# Patient Record
Sex: Female | Born: 1955 | Race: White | Hispanic: No | Marital: Married | Smoking: Former smoker
Health system: Southern US, Community
[De-identification: ages and names within clinical notes are randomized; demographics above are authoritative.]

## PROBLEM LIST (undated history)

## (undated) DIAGNOSIS — K469 Unspecified abdominal hernia without obstruction or gangrene: Secondary | ICD-10-CM

## (undated) DIAGNOSIS — K219 Gastro-esophageal reflux disease without esophagitis: Secondary | ICD-10-CM

## (undated) DIAGNOSIS — I10 Essential (primary) hypertension: Secondary | ICD-10-CM

## (undated) DIAGNOSIS — N289 Disorder of kidney and ureter, unspecified: Secondary | ICD-10-CM

## (undated) DIAGNOSIS — R110 Nausea: Secondary | ICD-10-CM

## (undated) DIAGNOSIS — R079 Chest pain, unspecified: Secondary | ICD-10-CM

## (undated) DIAGNOSIS — N390 Urinary tract infection, site not specified: Secondary | ICD-10-CM

## (undated) DIAGNOSIS — R002 Palpitations: Secondary | ICD-10-CM

## (undated) HISTORY — DX: Unspecified abdominal hernia without obstruction or gangrene: K46.9

## (undated) HISTORY — DX: Chest pain, unspecified: R07.9

## (undated) HISTORY — DX: Nausea: R11.0

## (undated) HISTORY — DX: Urinary tract infection, site not specified: N39.0

## (undated) HISTORY — PX: HERNIA REPAIR: SHX51

## (undated) HISTORY — DX: Essential (primary) hypertension: I10

## (undated) HISTORY — DX: Morbid (severe) obesity due to excess calories: E66.01

## (undated) HISTORY — DX: Palpitations: R00.2

## (undated) HISTORY — PX: GLAUCOMA SURGERY: SHX656

---

## 1998-01-29 ENCOUNTER — Emergency Department (HOSPITAL_COMMUNITY): Admission: EM | Admit: 1998-01-29 | Discharge: 1998-01-29 | Payer: Self-pay | Admitting: Emergency Medicine

## 1998-02-15 ENCOUNTER — Emergency Department (HOSPITAL_COMMUNITY): Admission: EM | Admit: 1998-02-15 | Discharge: 1998-02-15 | Payer: Self-pay | Admitting: Internal Medicine

## 1998-10-08 ENCOUNTER — Emergency Department (HOSPITAL_COMMUNITY): Admission: EM | Admit: 1998-10-08 | Discharge: 1998-10-08 | Payer: Self-pay | Admitting: Emergency Medicine

## 1998-10-08 ENCOUNTER — Encounter: Payer: Self-pay | Admitting: Emergency Medicine

## 1998-12-03 ENCOUNTER — Other Ambulatory Visit: Admission: RE | Admit: 1998-12-03 | Discharge: 1998-12-03 | Payer: Self-pay | Admitting: Obstetrics & Gynecology

## 1999-01-07 ENCOUNTER — Other Ambulatory Visit: Admission: RE | Admit: 1999-01-07 | Discharge: 1999-01-07 | Payer: Self-pay | Admitting: Obstetrics & Gynecology

## 1999-02-24 ENCOUNTER — Other Ambulatory Visit: Admission: RE | Admit: 1999-02-24 | Discharge: 1999-02-24 | Payer: Self-pay | Admitting: Obstetrics & Gynecology

## 1999-02-24 ENCOUNTER — Encounter (INDEPENDENT_AMBULATORY_CARE_PROVIDER_SITE_OTHER): Payer: Self-pay | Admitting: Specialist

## 1999-06-03 ENCOUNTER — Other Ambulatory Visit: Admission: RE | Admit: 1999-06-03 | Discharge: 1999-06-03 | Payer: Self-pay | Admitting: Obstetrics & Gynecology

## 1999-07-20 ENCOUNTER — Emergency Department (HOSPITAL_COMMUNITY): Admission: EM | Admit: 1999-07-20 | Discharge: 1999-07-20 | Payer: Self-pay | Admitting: Emergency Medicine

## 1999-07-30 ENCOUNTER — Encounter: Payer: Self-pay | Admitting: Occupational Medicine

## 1999-07-30 ENCOUNTER — Encounter: Admission: RE | Admit: 1999-07-30 | Discharge: 1999-07-30 | Payer: Self-pay | Admitting: Occupational Medicine

## 1999-08-03 ENCOUNTER — Emergency Department (HOSPITAL_COMMUNITY): Admission: EM | Admit: 1999-08-03 | Discharge: 1999-08-03 | Payer: Self-pay | Admitting: *Deleted

## 1999-08-30 ENCOUNTER — Encounter: Payer: Self-pay | Admitting: Neurosurgery

## 1999-08-30 ENCOUNTER — Emergency Department (HOSPITAL_COMMUNITY): Admission: EM | Admit: 1999-08-30 | Discharge: 1999-08-30 | Payer: Self-pay | Admitting: Emergency Medicine

## 1999-08-30 ENCOUNTER — Ambulatory Visit (HOSPITAL_COMMUNITY): Admission: RE | Admit: 1999-08-30 | Discharge: 1999-08-30 | Payer: Self-pay | Admitting: Neurosurgery

## 1999-10-04 ENCOUNTER — Encounter: Admission: RE | Admit: 1999-10-04 | Discharge: 2000-01-02 | Payer: Self-pay | Admitting: Anesthesiology

## 1999-11-11 ENCOUNTER — Ambulatory Visit (HOSPITAL_COMMUNITY): Admission: RE | Admit: 1999-11-11 | Discharge: 1999-11-11 | Payer: Self-pay | Admitting: Neurosurgery

## 1999-11-11 ENCOUNTER — Encounter: Payer: Self-pay | Admitting: Neurosurgery

## 1999-12-03 ENCOUNTER — Encounter: Payer: Self-pay | Admitting: Neurosurgery

## 1999-12-03 ENCOUNTER — Inpatient Hospital Stay (HOSPITAL_COMMUNITY): Admission: RE | Admit: 1999-12-03 | Discharge: 1999-12-04 | Payer: Self-pay | Admitting: Neurosurgery

## 2000-01-17 ENCOUNTER — Ambulatory Visit (HOSPITAL_COMMUNITY): Admission: RE | Admit: 2000-01-17 | Discharge: 2000-01-17 | Payer: Self-pay | Admitting: Neurosurgery

## 2000-01-17 ENCOUNTER — Encounter: Payer: Self-pay | Admitting: Neurosurgery

## 2000-03-20 ENCOUNTER — Encounter: Payer: Self-pay | Admitting: Neurosurgery

## 2000-03-20 ENCOUNTER — Ambulatory Visit (HOSPITAL_COMMUNITY): Admission: RE | Admit: 2000-03-20 | Discharge: 2000-03-20 | Payer: Self-pay | Admitting: Neurosurgery

## 2000-07-05 ENCOUNTER — Emergency Department (HOSPITAL_COMMUNITY): Admission: EM | Admit: 2000-07-05 | Discharge: 2000-07-05 | Payer: Self-pay | Admitting: Emergency Medicine

## 2002-01-08 ENCOUNTER — Emergency Department (HOSPITAL_COMMUNITY): Admission: EM | Admit: 2002-01-08 | Discharge: 2002-01-08 | Payer: Self-pay | Admitting: Emergency Medicine

## 2002-07-29 ENCOUNTER — Emergency Department (HOSPITAL_COMMUNITY): Admission: EM | Admit: 2002-07-29 | Discharge: 2002-07-29 | Payer: Self-pay | Admitting: Emergency Medicine

## 2002-11-08 HISTORY — PX: BLADDER SUSPENSION: SHX72

## 2002-11-14 ENCOUNTER — Ambulatory Visit (HOSPITAL_COMMUNITY): Admission: RE | Admit: 2002-11-14 | Discharge: 2002-11-14 | Payer: Self-pay | Admitting: Urology

## 2002-11-14 ENCOUNTER — Ambulatory Visit (HOSPITAL_BASED_OUTPATIENT_CLINIC_OR_DEPARTMENT_OTHER): Admission: RE | Admit: 2002-11-14 | Discharge: 2002-11-14 | Payer: Self-pay | Admitting: Urology

## 2002-12-12 ENCOUNTER — Emergency Department (HOSPITAL_COMMUNITY): Admission: EM | Admit: 2002-12-12 | Discharge: 2002-12-13 | Payer: Self-pay | Admitting: Emergency Medicine

## 2002-12-31 ENCOUNTER — Other Ambulatory Visit: Admission: RE | Admit: 2002-12-31 | Discharge: 2002-12-31 | Payer: Self-pay | Admitting: Obstetrics and Gynecology

## 2003-01-08 HISTORY — PX: ABDOMINAL HYSTERECTOMY: SHX81

## 2003-01-14 ENCOUNTER — Ambulatory Visit (HOSPITAL_COMMUNITY): Admission: RE | Admit: 2003-01-14 | Discharge: 2003-01-14 | Payer: Self-pay | Admitting: Obstetrics and Gynecology

## 2003-02-06 ENCOUNTER — Inpatient Hospital Stay (HOSPITAL_COMMUNITY): Admission: RE | Admit: 2003-02-06 | Discharge: 2003-02-08 | Payer: Self-pay | Admitting: Obstetrics and Gynecology

## 2003-02-06 ENCOUNTER — Encounter (INDEPENDENT_AMBULATORY_CARE_PROVIDER_SITE_OTHER): Payer: Self-pay | Admitting: Specialist

## 2003-04-08 ENCOUNTER — Emergency Department (HOSPITAL_COMMUNITY): Admission: EM | Admit: 2003-04-08 | Discharge: 2003-04-08 | Payer: Self-pay | Admitting: Emergency Medicine

## 2003-04-10 ENCOUNTER — Ambulatory Visit (HOSPITAL_COMMUNITY): Admission: RE | Admit: 2003-04-10 | Discharge: 2003-04-10 | Payer: Self-pay | Admitting: Orthopedic Surgery

## 2003-12-30 ENCOUNTER — Ambulatory Visit: Payer: Self-pay | Admitting: *Deleted

## 2003-12-31 ENCOUNTER — Ambulatory Visit (HOSPITAL_COMMUNITY): Admission: RE | Admit: 2003-12-31 | Discharge: 2003-12-31 | Payer: Self-pay | Admitting: *Deleted

## 2003-12-31 ENCOUNTER — Encounter: Payer: Self-pay | Admitting: Cardiology

## 2004-01-02 ENCOUNTER — Ambulatory Visit (HOSPITAL_COMMUNITY): Admission: RE | Admit: 2004-01-02 | Discharge: 2004-01-02 | Payer: Self-pay | Admitting: *Deleted

## 2004-01-05 ENCOUNTER — Encounter: Admission: RE | Admit: 2004-01-05 | Discharge: 2004-01-05 | Payer: Self-pay | Admitting: *Deleted

## 2004-04-15 ENCOUNTER — Encounter: Admission: RE | Admit: 2004-04-15 | Discharge: 2004-04-15 | Payer: Self-pay | Admitting: Specialist

## 2004-05-12 ENCOUNTER — Observation Stay (HOSPITAL_COMMUNITY): Admission: RE | Admit: 2004-05-12 | Discharge: 2004-05-13 | Payer: Self-pay | Admitting: Specialist

## 2004-05-12 ENCOUNTER — Ambulatory Visit (HOSPITAL_BASED_OUTPATIENT_CLINIC_OR_DEPARTMENT_OTHER): Admission: RE | Admit: 2004-05-12 | Discharge: 2004-05-12 | Payer: Self-pay | Admitting: Specialist

## 2004-05-16 ENCOUNTER — Emergency Department (HOSPITAL_COMMUNITY): Admission: EM | Admit: 2004-05-16 | Discharge: 2004-05-16 | Payer: Self-pay | Admitting: Emergency Medicine

## 2004-09-06 ENCOUNTER — Encounter: Admission: RE | Admit: 2004-09-06 | Discharge: 2004-09-06 | Payer: Self-pay | Admitting: Specialist

## 2004-10-21 ENCOUNTER — Encounter: Admission: RE | Admit: 2004-10-21 | Discharge: 2004-10-21 | Payer: Self-pay | Admitting: Specialist

## 2005-01-24 ENCOUNTER — Encounter: Admission: RE | Admit: 2005-01-24 | Discharge: 2005-01-24 | Payer: Self-pay | Admitting: Specialist

## 2005-03-10 ENCOUNTER — Ambulatory Visit (HOSPITAL_COMMUNITY): Admission: RE | Admit: 2005-03-10 | Discharge: 2005-03-11 | Payer: Self-pay | Admitting: Specialist

## 2005-07-01 ENCOUNTER — Ambulatory Visit: Payer: Self-pay | Admitting: Gastroenterology

## 2005-07-05 ENCOUNTER — Encounter: Admission: RE | Admit: 2005-07-05 | Discharge: 2005-07-05 | Payer: Self-pay | Admitting: Specialist

## 2005-07-11 ENCOUNTER — Ambulatory Visit: Payer: Self-pay | Admitting: Gastroenterology

## 2005-12-16 ENCOUNTER — Ambulatory Visit (HOSPITAL_COMMUNITY): Admission: RE | Admit: 2005-12-16 | Discharge: 2005-12-16 | Payer: Self-pay | Admitting: Specialist

## 2005-12-30 ENCOUNTER — Emergency Department (HOSPITAL_COMMUNITY): Admission: EM | Admit: 2005-12-30 | Discharge: 2005-12-30 | Payer: Self-pay | Admitting: Emergency Medicine

## 2005-12-31 ENCOUNTER — Emergency Department (HOSPITAL_COMMUNITY): Admission: EM | Admit: 2005-12-31 | Discharge: 2005-12-31 | Payer: Self-pay | Admitting: Emergency Medicine

## 2006-01-03 ENCOUNTER — Emergency Department (HOSPITAL_COMMUNITY): Admission: EM | Admit: 2006-01-03 | Discharge: 2006-01-03 | Payer: Self-pay | Admitting: Emergency Medicine

## 2006-02-07 HISTORY — PX: OTHER SURGICAL HISTORY: SHX169

## 2006-06-02 ENCOUNTER — Ambulatory Visit (HOSPITAL_BASED_OUTPATIENT_CLINIC_OR_DEPARTMENT_OTHER): Admission: RE | Admit: 2006-06-02 | Discharge: 2006-06-02 | Payer: Self-pay | Admitting: Specialist

## 2006-08-09 ENCOUNTER — Encounter: Admission: RE | Admit: 2006-08-09 | Discharge: 2006-08-09 | Payer: Self-pay | Admitting: Specialist

## 2006-09-06 ENCOUNTER — Emergency Department (HOSPITAL_COMMUNITY): Admission: EM | Admit: 2006-09-06 | Discharge: 2006-09-06 | Payer: Self-pay | Admitting: Emergency Medicine

## 2007-09-09 ENCOUNTER — Emergency Department (HOSPITAL_COMMUNITY): Admission: EM | Admit: 2007-09-09 | Discharge: 2007-09-09 | Payer: Self-pay | Admitting: Emergency Medicine

## 2007-12-03 ENCOUNTER — Inpatient Hospital Stay (HOSPITAL_COMMUNITY): Admission: RE | Admit: 2007-12-03 | Discharge: 2007-12-05 | Payer: Self-pay | Admitting: Urology

## 2007-12-03 ENCOUNTER — Encounter (INDEPENDENT_AMBULATORY_CARE_PROVIDER_SITE_OTHER): Payer: Self-pay | Admitting: Urology

## 2007-12-10 ENCOUNTER — Observation Stay (HOSPITAL_COMMUNITY): Admission: AD | Admit: 2007-12-10 | Discharge: 2007-12-11 | Payer: Self-pay | Admitting: Urology

## 2007-12-14 ENCOUNTER — Emergency Department (HOSPITAL_COMMUNITY): Admission: EM | Admit: 2007-12-14 | Discharge: 2007-12-14 | Payer: Self-pay | Admitting: Emergency Medicine

## 2008-07-08 ENCOUNTER — Emergency Department (HOSPITAL_COMMUNITY): Admission: EM | Admit: 2008-07-08 | Discharge: 2008-07-08 | Payer: Self-pay | Admitting: Emergency Medicine

## 2008-11-29 ENCOUNTER — Emergency Department (HOSPITAL_COMMUNITY): Admission: EM | Admit: 2008-11-29 | Discharge: 2008-11-29 | Payer: Self-pay | Admitting: Emergency Medicine

## 2009-03-24 ENCOUNTER — Encounter: Admission: RE | Admit: 2009-03-24 | Discharge: 2009-03-24 | Payer: Self-pay | Admitting: Family Medicine

## 2009-10-12 ENCOUNTER — Emergency Department (HOSPITAL_COMMUNITY): Admission: EM | Admit: 2009-10-12 | Discharge: 2009-10-12 | Payer: Self-pay | Admitting: Emergency Medicine

## 2009-10-14 ENCOUNTER — Encounter: Payer: Self-pay | Admitting: Cardiology

## 2009-10-14 DIAGNOSIS — I10 Essential (primary) hypertension: Secondary | ICD-10-CM | POA: Insufficient documentation

## 2009-10-15 ENCOUNTER — Ambulatory Visit: Payer: Self-pay | Admitting: Cardiology

## 2009-10-15 DIAGNOSIS — R0602 Shortness of breath: Secondary | ICD-10-CM | POA: Insufficient documentation

## 2009-10-22 ENCOUNTER — Telehealth (INDEPENDENT_AMBULATORY_CARE_PROVIDER_SITE_OTHER): Payer: Self-pay | Admitting: *Deleted

## 2009-10-26 ENCOUNTER — Encounter (HOSPITAL_COMMUNITY): Admission: RE | Admit: 2009-10-26 | Discharge: 2010-01-07 | Payer: Self-pay | Admitting: Cardiology

## 2009-10-26 ENCOUNTER — Encounter: Payer: Self-pay | Admitting: Internal Medicine

## 2009-10-26 ENCOUNTER — Ambulatory Visit: Payer: Self-pay | Admitting: Internal Medicine

## 2009-10-26 ENCOUNTER — Ambulatory Visit: Payer: Self-pay

## 2010-02-28 ENCOUNTER — Encounter: Payer: Self-pay | Admitting: Specialist

## 2010-03-07 LAB — CONVERTED CEMR LAB: Pro B Natriuretic peptide (BNP): 7.6 pg/mL (ref 0.0–100.0)

## 2010-03-09 NOTE — Assessment & Plan Note (Signed)
Summary: Nuclear Testing  Nuclear Med Background Indications for Stress Test: Evaluation for Ischemia   History: GXT  History Comments: .  Symptoms: Chest Pain, Chest Pressure, Chest Pressure with Exertion, Diaphoresis, Dizziness, DOE, Fatigue, Light-Headedness, Rapid HR  Symptoms Comments: CP>(L) shoulder. Last episode of CP:now, 2/10.  c/o "constant" CP   Nuclear Pre-Procedure Cardiac Risk Factors: Family History - CAD, History of Smoking, Hypertension Caffeine/Decaff Intake: 7:00 AM yesterday     NPO After: 11:00 PM Lungs: Clear IV 0.9% NS with Angio Cath: 22g     IV Site: L Forearm IV Started by: Irean Hong, RN Chest Size (in) 40     Cup Size C     Height (in): 63 Weight (lb): 220 BMI: 39.11  Nuclear Med Study 1 or 2 day study:  1 day     Stress Test Type:  Stress Reading MD:  Dietrich Pates, MD     Referring MD:  Rollene Rotunda, MD Resting Radionuclide:  Technetium 65m Tetrofosmin     Resting Radionuclide Dose:  11 mCi  Stress Radionuclide:  Technetium 45m Tetrofosmin     Stress Radionuclide Dose:  33 mCi   Stress Protocol Exercise Time (min):  6:30 min     Max HR:  146 bpm     Predicted Max HR:  166 bpm  Max Systolic BP: 171 mm Hg     Percent Max HR:  87.95 %Rate Pressure Product:  16109    Stress Test Technologist:  Rea [employer], CMA-N     Nuclear Technologist:  Domenic Polite, CNMT  Rest Procedure  Myocardial perfusion imaging was performed at rest 45 minutes following the intravenous administration of Technetium 34m Tetrofosmin.  Stress Procedure  The patient exercised for 6:30.  The patient stopped due to fatigue.  She c/o chest pain, 2/10, prior to exercise that only increased to 3/10 with exercise.  There were no diagnostic ST-T wave changes.  Technetium 46m Tetrofosmin was injected at peak exercise and myocardial perfusion imaging was performed after a brief delay.  QPS Raw Data Images:  Images were motion corrected.  Soft tissue diaphragm  Stress Images:   Normal homogeneous uptake in all areas of the myocardium. Rest Images:  Normal homogeneous uptake in all areas of the myocardium. Subtraction (SDS):  No evidence of ischemia. Transient Ischemic Dilatation:  0.90  (Normal <1.22)  Lung/Heart Ratio:  0.24  (Normal <0.45)  Quantitative Gated Spect Images QGS EDV:  63 ml QGS ESV:  15 ml QGS EF:  76 %   Overall Impression  Exercise Capacity: Good exercise capacity. BP Response: Normal blood pressure response. Clinical Symptoms: Mild chest pain/dyspnea. ECG Impression: No significant ST segment change suggestive of ischemia. Overall Impression: Normal stress nuclear study.  Appended Document: Nuclear Testing pt aware of results  Appended Document: Nuclear Testing With normal stress and BNP etiology of dyspnea is unlikely to be cardiac.

## 2010-03-09 NOTE — Assessment & Plan Note (Signed)
Summary: np6/ chest discomfort, new onset htn./ pt has medicare/ gd   Visit Type:  Initial Consult Primary [facility] [facility]:  Dr. Benedetto Goad  CC:  chest pain.  History of Present Illness: The patient presents for evaluation of chest discomfort and dyspnea. She said she's been under stress since the death of her mother in 10-16-22. She noticed her blood pressure was up in the 170/100 range. She had been off of hydrochlorothiazide which she had been prescribed in the past. She restarted this and her blood pressure has come down to the feedings observed today. However, she's been noticing shortness of breath for 2 weeks. This is happening walking 40 yards on level ground. She's also had some shortness of breath at rest and has noted that she's required 3-4 pillows at night. She's had some diaphoresis at times when she gets the shortness of breath. She's had some chest pressure. This has also been left shoulder discomfort. It's been a substernal discomfort. It is sporadic and may have been when she exercises. She may get the shortness of breath and diaphoresis. It goes away when she stops she is doing. She's not had this before. She had a stress test about 5 years ago which she reports was negative. She is not describing palpitations, presyncope or syncope.  Current Medications (verified): 1)  Nexium 40 Mg Cpdr (Esomeprazole Magnesium) .... Daily 2)  Hydrochlorothiazide 25 Mg Tabs (Hydrochlorothiazide) .... Take One Tablet By Mouth Daily.  Allergies (verified): 1)  ! Cortisone 2)  ! * Ivp Dye  Past History:  Past Medical History: HYPERTENSION  Recurrent urinary and kidney infections Hypertension  Past Surgical History: Left nephrectomy (resected secondary to recurrent infections) Hysterectomy Back fusion Bladder surgery C-Section x2  Family History: Father MI age 7s, alive at age 71 Mother died recently, aortic disection, PVD, CVA  Social History: Married, 2 children Tobacco, quit 1993 (22  years, peak 1.5 ppd)  Review of Systems       Positive for headaches and dizziness, back pain, recent diarrhea, leg cramping. Negative for all other systems.  Vital Signs:  Patient profile:   55 year old female Height:      63 inches Weight:      221 pounds BMI:     39.29 Pulse rate:   81 / minute BP sitting:   130 / 86  (left arm) Cuff size:   large  Vitals Entered By: Burnett Kanaris, CNA (October 15, 2009 11:48 AM)  Physical Exam  General:  Well developed, well nourished, in no acute distress. Head:  normocephalic and atraumatic Eyes:  PERRLA/EOM intact; conjunctiva and lids normal. Mouth:  Teeth, gums and palate normal. Oral mucosa normal. Neck:  Neck supple, no JVD. No masses, thyromegaly or abnormal cervical nodes. Chest Wall:  no deformities or breast masses noted Lungs:  Clear bilaterally to auscultation and percussion. Abdomen:  Bowel sounds positive; abdomen soft and non-tender without masses, organomegaly, or hernias noted. No hepatosplenomegaly. Msk:  Back normal, normal gait. Muscle strength and tone normal. Extremities:  No clubbing or cyanosis. Neurologic:  Alert and oriented x 3. Skin:  Intact without lesions or rashes. Cervical Nodes:  no significant adenopathy Inguinal Nodes:  no significant adenopathy Psych:  Normal affect.   Detailed Cardiovascular Exam  Neck    Carotids: Carotids full and equal bilaterally without bruits.      Neck Veins: Normal, no JVD.    Heart    Inspection: no deformities or lifts noted.  Palpation: normal PMI with no thrills palpable.      Auscultation: regular rate and rhythm, S1, S2 without murmurs, rubs, gallops, or clicks.    Vascular    Abdominal Aorta: no palpable masses, pulsations, or audible bruits.      Femoral Pulses: normal femoral pulses bilaterally.      Pedal Pulses: normal pedal pulses bilaterally.      Radial Pulses: normal radial pulses bilaterally.      Peripheral Circulation: no clubbing, cyanosis,  or edema noted with normal capillary refill.     EKG  Procedure date:  10/15/2009  Findings:      Sinus rhythm, rate 79, axis within normal limits, intervals within normal limits, no acute ST-T wave changes  Impression & Recommendations:  Problem # 1:  DYSPNEA (ICD-786.05) The patient has concerning symptoms of dyspnea and some chest discomfort. She has cardiovascular risk factors. In particular she has a family history. Given this the pretest probability of obstructive coronary disease is at least moderately high. Stress perfusion imaging is indicated. Further evaluation will be based on these results. I will also be checking a BNP level and consider further testing based on these results.  Problem # 2:  OVERWEIGHT (ICD-278.02) We have a long discussion about weight loss and I gave her specific suggestions on diet and exercise.  Problem # 3:  HYPERTENSION (ICD-401.9) Her blood pressure is controlled and hydrochlorothiazide a reasonable choice. She should have her electrolytes followed by her primary physician.  Other Orders: EKG w/ Interpretation (93000) TLB-BNP (B-Natriuretic Peptide) (83880-BNPR) Nuclear Stress Test (Nuc Stress Test)  Patient Instructions: 1)  Your physician recommends that you schedule a follow-up appointment in: 1 month with Dr Antoine Poche 2)  Your physician recommends that you have lab work today:  BNP 3)  Your physician recommends that you continue on your current medications as directed. Please refer to the Current Medication list given to you today. 4)  Your physician has requested that you have an exercise stress myoview.  For further information please visit [email or web address]  Please follow instruction sheet, as given.  Appended Document: np6/ chest discomfort, new onset htn./ pt has medicare/ gd BNP OK.  Awaiting other study results.

## 2010-03-09 NOTE — Letter (Signed)
Summary: [facility] Office Visit Note   [facility] Office Visit Note   Imported By: Roderic Ovens 10/27/2009 15:34:25  _____________________________________________________________________  External Attachment:    Type:   Image     Comment:   External Document

## 2010-03-09 NOTE — Progress Notes (Signed)
Summary: Nuclear Pre-Procedure  Phone Note Outgoing Call   Call placed by: Milana Na, EMT-P,  October 22, 2009 2:25 PM Summary of Call: Reviewed information on Myoview Information Sheet (see scanned document for further details).  Spoke with patient.     Nuclear Med Background Indications for Stress Test: Evaluation for Ischemia   History: GXT  History Comments: CHF  Symptoms: Chest Pain, Chest Pressure, Chest Pressure with Exertion, Diaphoresis, DOE    Nuclear Pre-Procedure Cardiac Risk Factors: Family History - CAD, History of Smoking, Hypertension Height (in): 63  Nuclear Med Study Referring MD:  J.Hochrein

## 2010-04-22 LAB — POCT I-STAT, CHEM 8
BUN: 23 mg/dL (ref 6–23)
Calcium, Ion: 1.13 mmol/L (ref 1.12–1.32)
Chloride: 104 mEq/L (ref 96–112)
Creatinine, Ser: 0.9 mg/dL (ref 0.4–1.2)
Glucose, Bld: 121 mg/dL — ABNORMAL HIGH (ref 70–99)
HCT: 46 % (ref 36.0–46.0)
Hemoglobin: 15.6 g/dL — ABNORMAL HIGH (ref 12.0–15.0)
Sodium: 139 mEq/L (ref 135–145)
TCO2: 28 mmol/L (ref 0–100)

## 2010-04-22 LAB — CBC
RBC: 5.19 MIL/uL — ABNORMAL HIGH (ref 3.87–5.11)
RDW: 12.9 % (ref 11.5–15.5)

## 2010-04-22 LAB — URINALYSIS, ROUTINE W REFLEX MICROSCOPIC
Bilirubin Urine: NEGATIVE
Glucose, UA: NEGATIVE mg/dL
Hgb urine dipstick: NEGATIVE
Ketones, ur: NEGATIVE mg/dL
Nitrite: NEGATIVE
Specific Gravity, Urine: 1.013 (ref 1.005–1.030)
Urobilinogen, UA: 0.2 mg/dL (ref 0.0–1.0)
pH: 5 (ref 5.0–8.0)

## 2010-04-22 LAB — POCT CARDIAC MARKERS
CKMB, poc: 1.4 ng/mL (ref 1.0–8.0)
CKMB, poc: <1 ng/mL — ABNORMAL LOW (ref 1.0–8.0)
Myoglobin, poc: 62 ng/mL (ref 12–200)
Troponin i, poc: <0.05 ng/mL (ref 0.00–0.09)
Troponin i, poc: <0.05 ng/mL (ref 0.00–0.09)

## 2010-04-22 LAB — DIFFERENTIAL
Eosinophils Relative: 2 % (ref 0–5)
Neutro Abs: 4.4 10*3/uL (ref 1.7–7.7)
Neutrophils Relative %: 66 % (ref 43–77)

## 2010-06-22 NOTE — Op Note (Signed)
NAMESHAKARIA, RAPHAEL                 ACCOUNT NO.:  [id number]   MEDICAL RECORD NO.:  [id number]          PATIENT TYPE:  INP   LOCATION:  1417                         FACILITY:  [facility]   PHYSICIAN:  Heloise Purpura, MD      DATE OF BIRTH:  23-Nov-1955   DATE OF PROCEDURE:  12/03/2007  DATE OF DISCHARGE:                               OPERATIVE REPORT   PREOPERATIVE DIAGNOSES:  1. Recurrent left pyelonephritis.  2. Atrophic left kidney.  3. Left adrenal mass.   POSTOPERATIVE DIAGNOSES:  1. Recurrent left pyelonephritis.  2. Atrophic left kidney.  3. Left adrenal mass.   PROCEDURES:  Laparoscopic left nephrectomy and partial ureterectomy with  adrenalectomy.   SURGEON:  Dr. Heloise Purpura.   ASSISTANT:  Delia Chimes, nurse practitioner.   ANESTHESIA:  General.   COMPLICATIONS:  None.   ESTIMATED BLOOD LOSS:  50 mL.   SPECIMEN:  Left kidney, adrenal gland, and proximal ureter.   DISPOSITION OF SPECIMEN:  To pathology.   INDICATIONS:  Ms. Prell is a patient with a long history of recurrent  pyelonephritis particularly on the left side.  She has a known atrophic  left kidney that appears to be functioning poorly.  She has required  multiple hospitalizations for pyelonephritis and presented for possible  nephrectomy.  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  After discussion regarding these findings, she elected to  proceed with surgical therapy and a left nephrectomy.  Based on the size  of her mass, she also elected to undergo an adrenalectomy to exclude  malignancy despite the low probability based on imaging.  Potential  risks, complications, and alternative treatment options were discussed  in detail and informed consent was obtained.   DESCRIPTION OF PROCEDURE:  The patient was taken to the operating room  and a general anesthetic was administered.  She was given  preoperative  antibiotics, placed in the left modified flank position, and prepped and  draped in the usual sterile fashion.  Next a preoperative time-out was  performed.  A site was then selected lateral to the umbilicus for  placement of the camera port.  This was placed laterally due to the  patient's obesity.  This camera port was placed with the aid of an open  Hasson technique which allowed entry into the peritoneal cavity under  direct vision without difficulty.  Zero Vicryl holding sutures were then  placed into the anterior rectus sheath and the Hasson cannula was placed  and secured.  A pneumoperitoneum was established and the 30 degrees lens  was used to inspect the abdomen and there was no evidence for any intra-  abdominal injuries or other abnormalities.  The remaining ports were  then placed.  A 5 mm port was placed superiorly and laterally to the  camera port.  An additional 12 mm port was placed inferiorly and lateral  to the camera port.  These ports were placed under direct vision without  difficulty.  With the aid of  the harmonic scalpel, the white line of  Toldt was then incised along the length of the descending colon allowing  the colon be mobilized medially and the space between the mesocolon and  the anterior layer of Gerota's fascia to be developed.  The ureter and  gonadal vein were identified and were lifted anteriorly off the psoas  muscle with dissection proceeding toward the renal hilum.  Two separate  renal veins were identified with a single renal artery posterior to  these venous structures.  The renal artery was isolated with right-angle  dissection and was divided between multiple Hem-o-lok clips.  Each of  the renal veins was also then isolated and freed from the surrounding  structures with a right-angle dissection and ligated with multiple Hem-o-  lok clips and divided.  The renal vein was divided medial to the adrenal  vein.  Dissection proceeded  superiorly outside of Gerota's fascia and  the adrenal mass was identified.  It was mobilized circumferentially.  The patient was not noted to have findings consistent with  pheochromocytoma preoperatively.  However, anesthesia was notified that  the adrenal gland was being manipulated at this point.  No fluctuations  and heart rate or blood pressure were noted.  The attachments between  the kidney and the spleen were carefully taken down with a Harmonic  scalpel allowing the specimen to be freed superiorly.  Attention then  turned to the ureter and gonadal vein which were traced inferiorly.  The  gonadal vein was divided between multiple Hem-o-lok clips.  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  At this point, each ureter was ligated with multiple Hem-  o-lok clips and divided.  The lateral and posterior attachments to the  kidney were taken down with the harmonic scalpel allowing the specimen  to be freed.  There was noted to be a small amount of oozing from the  area of the renal hilum.  This was inspected and controlled with Hem-o-  lok clips and a piece of Surgicel for added assurance.  The original  camera port site was then inspected laparoscopically and a figure-of-  eight 0 Vicryl stitch was placed with the aid of the [employer]  needle but not yet tied down.  This camera port was then replaced and  the 12 mm port was removed.  The 15 mm EndoCatch II bag was then placed  through the lateral lower quadrant port site and the specimen was placed  in the bag for extraction.  This incision was slightly extended  laterally allowing the specimen to be removed intact within the within  the EndoCatch II bag.  This incision was closed at the fascial level in  two layers with a running #1 PDS suture.  The abdomen was then  reinsufflated and the camera was used to inspect  the renal hilum and  renal fossa.  Hemostasis appeared excellent even with a low  pneumoperitoneum.  Therefore the 5 mm port was then removed and the  Hassan cannula was removed with this fascial opening closed with the  previously placed figure-of-eight 0 Vicryl suture.  The holding sutures  were also used to help provide additional closure to the camera port  site.  The incisions were then all injected with 0.25% Marcaine and  reapproximated at the skin level.  Sterile dressings were applied.  The  patient  tolerated procedure without complications.  She was able to be  extubated and transferred to the recovery unit in satisfactory  condition.      Heloise Purpura, MD  Electronically Signed     LB/MEDQ  D:  12/03/2007  T:  12/04/2007  Job:  [id number]

## 2010-06-22 NOTE — Discharge Summary (Signed)
NAMEJAKERRIA, KINGBIRD                 ACCOUNT NO.:  [id number]   MEDICAL RECORD NO.:  [id number]          PATIENT TYPE:  INP   LOCATION:  1417                         FACILITY:  [facility]   PHYSICIAN:  Heloise Purpura, MD      DATE OF BIRTH:  04-14-1955   DATE OF ADMISSION:  12/03/2007  DATE OF DISCHARGE:  12/05/2007                               DISCHARGE SUMMARY   ADMISSION DIAGNOSES:  1. Recurrent left pyelonephritis.  2. Atrophic left kidney.  3. Left adrenal mass.   DISCHARGE DIAGNOSES:  1. Recurrent left pyelonephritis.  2. Atrophic left kidney.  3. Left adrenal mass.   PROCEDURE:  Laparoscopic left nephrectomy and partial ureterectomy with  adrenalectomy.   HISTORY AND PHYSICAL:  For full details, please see admission history  and physical.  Briefly, Ms. Mills is a 55 year old female with a long  history of recurrent pyelonephritis, particularly on the left side.  She  has a know atrophic left kidney that appears to be functioning poorly.  She has required multiple hospitalizations for pyelonephritis and  presented for possible nephrectomy.  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  After discussion regarding these  findings, she elected to proceed with surgical therapy and a left  nephrectomy.  Based on size of her mass, she also elected to undergo an  adrenalectomy to exclude malignancy despite the low probability based on  imaging.   HOSPITAL COURSE:  On December 03, 2007, she was taken to the OR and  underwent a laparoscopic left nephrectomy and partial ureterectomy with  adrenalectomy, which she tolerated well and without complications.  Postoperatively she was able to be transferred to a regular hospital  room following recovery from anesthesia.  She was able to begin  ambulating that evening.  She remained hemodynamically stable.  Her  postoperative hematocrit was 38.7.   Her kidney function remained stable  postoperatively, as evidenced by her creatinine of 0.86.  On the morning  of postoperative day #1, her hematocrit was also found to be stable at  35.8 and her kidney function remained stable with her creatinine at  0.92.  She was placed on a clear liquid diet and continued to ambulate.  She was re-evaluated on the afternoon of postoperative day #1.  Her  urine output was excellent and vital signs were stable; therefore, the  Foley catheter was removed.  She was also able to transition from her IV  pain medications to oral pain medications.   On postoperative day #2, she continued to tolerate her clear liquid diet  with little nausea and no vomiting.  She also began having flatus.  Therefore, she was able to be transitioned to a regular diet.  Her urine  output and vital signs remained stable.  Her kidney function also  remained stable, as noted by her BUN of seven and her creatinine of  0.91.  She was re-evaluated on the afternoon of postoperative day #2.  She was able to tolerate her oral pain medication and  regular diet with  no nausea or vomiting.  She was able to continue to ambulate without  voicing any concerns.  Therefore, it was felt she was stable for  discharge and had met all discharge criteria.   DISPOSITION:  Home.   DISCHARGE MEDICATIONS:  She was instructed to resume her regular  medications, including Nexium.  In addition, she was also provided a  prescription for Vicodin to take as needed for pain and told to use  Colace as a stool softener.   DISCHARGE INSTRUCTIONS:  She was instructed to be ambulatory, but  specifically told to refrain from any heavy lifting, strenuous activity,  or driving.   FOLLOWUP:  She will follow up in 1 week for removal of skin staples.      Delia Chimes, NP      Heloise Purpura, MD  Electronically Signed    MA/MEDQ  D:  12/05/2007  T:  12/06/2007  Job:  [id number]

## 2010-06-25 NOTE — Op Note (Signed)
[place]  Patient:    Marisa Ryan, Marisa Ryan                        MRN: [id number] Proc. Date: 12/03/99 Adm. 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  PROCEDURES: 1. Bilateral L4-5 decompressive laminectomy followed by Ray cage interbody    fusion with local bone graft. 2. Microdissection L5 nerve roots and L4-5 disk bilaterally.  SURGEON:  Reinaldo Meeker, M.D.  ASSISTANT:  Julio Sicks, M.D.  PROCEDURE IN DETAIL:  After being placed in the prone position, the patients back was prepped and draped in usual sterile fashion.  Localizing fluoroscopy was used prior to incision to identify the appropriate level.  Midline incision made above the spinous processes of L4 and L5.  Using the Bovie cutting current, the incision was carried down the spinous processes. Subperiosteal dissection was then carried out bilaterally on the spinous processes and lamina and McCullough self-retaining retractor was placed for exposure.  A second x-ray was taken to confirm approach to the appropriate level and this was correct.  Starting on the patients left side, high-speed drill was used to perform a generous laminotomy by removing the inferior two-thirds of the L4 lamina, the medial two-thirds of the facet joint and the superior one-half of the L5 lamina.  Residual bone was removed and saved for use in the cages at the end of the case.  Ligamentum flavum was removed in a piecemeal fashion.  A similar decompression was carried out on the patients right side.  Once again, generously removing the inferior two-thirds of the L4 lamina and the medial two-thirds of the facet joint and the superior one-half of the L5 lamina.  Once again, the residual bone was removed and saved for use in the  cages at  the end the case.  Midline structures with interspinous ligament and spinous processes were then removed to complete the decompression.  The microscope was draped and brought into the field and used for the remainder of the case.  Starting on the patients right side, microdissection technique was used to identify the lateral aspect of the thecal sac and L5 nerve root.  Further coagulation was carried out down the floor of canal to identify the L4-5 disk.  After coagulating on the annulus, the annulus was incised with a 15-blade.  Using pituitary rongeurs and curets, thorough disk space cleanout was carried out.  At the same time, great care was taken to avoid injury to the neural elements and this was successfully done.  Inspection was then turned to the patients left side.  Scar from previous surgery was identified, dissected free of the thecal sac and L5 nerve root.  L5 nerve root was mobilized starting at the pedicle without difficulty.  Annulus of the disk was then incised after coagulation and thoroughly cleaned out once again.  At this point, inspection was carried out in all directions for any evidence of residual compression, none could be identified. Microscope was removed from the field and fluoroscopy was brought back in. Under fluoroscopic guidance, starting on the patients left side, Tang retractor was placed using a 14 mm Tang.  After confirming good placement under  fluoroscopy, the channel was cut and tapped.  A 14 x 26 mm cages were then placed with difficulty.  The Tang was removed and neural elements were found to be unharmed.  A similar procedure was carried out on the patients right side and once again, placement of the Tang, cutting in, tapping in, placing the cage under fluoroscopy to ensure good placement.  At this point, large amounts of irrigation were carried out.  Final fluoroscopy in the lateral and A/P direction showed the cages to be in excellent position.   Bone from the laminotomy was then packed within the cages and the caps placed without difficulty.  Large amounts of irrigation were carried out at this time.  Small amount of tissue was then placed along the superior thecal sac. The wound was then closed using interrupted Vicryl on the muscle, fascia, subcutaneous and subcuticular tissues and staples on the skin.  Sterile dressing was then applied.  Patient was extubated and taken to recovery room in stable condition. DD:  12/03/99 TD:  12/03/99 Job: [id number]

## 2010-06-25 NOTE — H&P (Signed)
[facility]  Patient:    Marisa Ryan, Marisa Ryan                        MRN: [id number] Adm. Date:  [id number] Attending:  Thyra Breed                         History and Physical  FOLLOW-UP:  Geri called to let me know that she had broken out with hives. This is approximately 4:30 on October 13, 1999.  The patient previously received an epidural steroid injection today.  She has taken her Flexeril and her Darvocet earlier today.  She had taken a Benadryl and I spoke to her for about 10 minutes, and by the time we had come to the close of our conversation, her hives had improved.  I advised her to go ahead and take Benadryl 25 mg every four hours until tomorrow morning, then hold off and see if the hives recurred or remained away.  I encouraged her to be very careful about taking the Darvocet and Flexeril.  I suspected she might have an allergy to Betadine.  I doubt that she has an allergy to lidocaine, as it is extremely rare, and I would doubt that it would be an allergy to the Medrol, although it is a possibility.  She was encouraged to call us should she have a recurrence or any further problems. DD:  10/13/99 TD:  10/14/99 Job: [id number]

## 2010-06-25 NOTE — Op Note (Signed)
Marisa Ryan, Marisa Ryan                 ACCOUNT NO.:  [id number]   MEDICAL RECORD NO.:  [id number]          PATIENT TYPE:  AMB   LOCATION:  DAY                          FACILITY:  [facility]   PHYSICIAN:  Jene Every, M.D.    DATE OF BIRTH:  01/15/1956   DATE OF PROCEDURE:  12/16/2005  DATE OF DISCHARGE:                                 OPERATIVE REPORT   PREOPERATIVE DIAGNOSIS:  Carpal tunnel syndrome, left.   POSTOPERATIVE DIAGNOSIS:  Carpal tunnel syndrome, left.   OPERATION/PROCEDURE:  Left carpal tunnel release.   ANESTHESIA:  Bier block.   SURGEON:  Jene Every, M.D.   ASSISTANT:  Roma Schanz, P.A.-C.   INDICATIONS:  The patient is a 55 year old with severe carpal tunnel  syndrome indicated for release.  Confirmed with nerve conduction study.   DESCRIPTION OF PROCEDURE:  With the patient in the supine position, after  induction of anesthetic with bier block and 1 g Kefzol, the arm was  supinated and placed in the left hand holder.  I made an incision just ulnar  to the thenar crease through the subcutaneous tissue only.  This was bluntly  dissected down to the transverse palmar fascia.  This was divided on the  ulnar surface of the incision.  We identified transverse carpal ligament.  This was divided on its ulnar side using the tenotomy scissors.  We then  identified the carpal ligament and utilizing the [place] tome dilators, we  protected the transverse carpal ligament, divided it proximally in an  ulnarward direction protecting the ulnar nerve in the palmar cutaneous  branch.  Additionally, we incised the transverse carpal ligament taking care  not to injure the neurovascular structures distally.  Upon entering the  canal, we noted the constriction of the median nerve.  The motor branch was  identified medially and protected at all times.  There was no evidence of  soft tissue within the carpal canal from the neurolysis.  The wound was then  copiously irrigated.  We  then reapproximated the fascia with 2-0 Vicryl  simple suture and closed the skin with 4-0 subcuticular nylon.  The wound  was dressed sterilely, placed in a volar splint.  A tourniquet was deflated  and was __________  upper extremity appreciated.   The patient tolerated the procedure well.  There were no complications.      Jene Every, M.D.  Electronically Signed     JB/MEDQ  D:  12/16/2005  T:  12/17/2005  Job:  [id number]

## 2010-06-25 NOTE — Op Note (Signed)
NAME:  Marisa Ryan, Marisa Ryan                           ACCOUNT NO.:  [id number]   MEDICAL RECORD NO.:  [id number]                   PATIENT TYPE:  INP   LOCATION:  9399                                 FACILITY:  [facility]   PHYSICIAN:  Randye Lobo, M.D.                DATE OF BIRTH:  03-Feb-1956   DATE OF PROCEDURE:  02/06/2003  DATE OF DISCHARGE:                                 OPERATIVE REPORT   PREOPERATIVE DIAGNOSES:  1. Left adnexal mass.  2. Uterine leiomyomata.  3. History of abnormal Pap smears.   POSTOPERATIVE DIAGNOSES:  1. Dilated left ureter.  2. Uterine leiomyomata.  3. History of abnormal Pap smears.   PROCEDURE:  Exploratory laparotomy, total abdominal hysterectomy with  bilateral salpingo-oophorectomy, peritoneal washings.   SURGEON:  Randye Lobo, M.D.   ASSISTANT:  Luvenia Redden, M.D.   ANESTHESIA:  General endotracheal.   INTRAVENOUS FLUIDS:  2100 mL Ringer's lactate.   ESTIMATED BLOOD LOSS:  150 mL.   URINE OUTPUT:  200 mL.   COMPLICATIONS:  None.   INDICATIONS:  The patient is a 55 year old gravida 2, para 2 Caucasian  female status post bilateral tubal ligation who was noted to have a left  adnexal mass on CT scan which was ordered to evaluate lower extremity edema  and chest pain.  The patient has no evidence of pulmonary embolus or  cardiopulmonary disease.  However, there was a 5.5 cm left adnexal cyst  which was appreciated at that time.  The patient presented for gynecologic  care, and a pelvic ultrasound was performed at followup to this documenting  uterine fibroids in addition to a 5.4 cm cystic tubular mass in the left  adnexal region which was thought to be separate from the ovary.  The patient  reported that she was having significant left-sided pain which was waking  her up at night, and she wished for a surgical evaluation and treatment.  Of  note, the patient's past medical history is significant for both back pain  for which she has  undergone a spinal fusion of L4 and L5 and also for a  congenital nonfunctional left kidney.  The patient has recently undergone a  suburethral sling for urinary incontinence.   A plan was made to proceed with an exploratory laparotomy with a total  abdominal hysterectomy and bilateral salpingo-oophorectomy and peritoneal  washings after risks, benefits, and alternatives were discussed with her.  She chose to proceed.   FINDINGS:  At the time of laparotomy, the uterus and ovaries were noted to  be normal.  The fallopian tubes were consistent with a bilateral tubal  ligation.  There was no evidence of either right or left adnexal mass.  Of  note, the left ureter was noted to be dilated and tortuous in the normal  retroperitoneal position along the left pelvic sidewall.  There was no  palpable left kidney.  The right kidney, the right ureter, liver,  gallbladder, appendix, sigmoid colon and all peritoneal surfaces appeared to  be normal.  There were omental adhesions to the anterior abdominal wall to  the level of the umbilicus which were completely lysed during the procedure.   SPECIMENS:  The uterus, tubes and ovaries were sent to pathology separate  from the peritoneal washings.   DESCRIPTION OF PROCEDURE:  The patient was reidentified in the preoperative  holding area.  She was taken to the operating room where general  endotracheal anesthesia was induced.  The patient received Ancef 1 g  intravenously for antibiotic prophylaxis and both TED hose and PAS stockings  for DVT prophylaxis.  The abdomen and the vagina were sterilely prepped and  a Foley catheter was placed inside the bladder.  The patient was then  sterilely draped.   A vertical midline incision was created sharply with the scalpel along the  line of the patient's previous midline incision.  This was carried down to  the fascia using monopolar cautery.  The fascia was then incised vertically  with the scalpel.  Hemostat  clamps were used to elevate the parietal  peritoneum which was then entered sharply with the Metzenbaum scissors.  The  incision was extended cranially and caudally.  The peritoneal washings were  performed at this time, and this was sent to pathology.  An exploration of  the abdominal and pelvic organs was performed, and the findings are as noted  above.   A self-retaining retractor was placed in the abdomen, and the bowel was  packed out of the upper abdomen with moistened lap pads.   The hysterectomy began by placing Kelly clamps across the adnexal structures  bilaterally.  Each of the round ligaments was sutured ligated with  transfixing sutures of 0 Vicryl, divided with monopolar cautery.  The  bladder flap was then created sharply with the Metzenbaum scissors.  The  posterior leaf of the broad ligaments were opened with monopolar cautery and  the infundibulopelvic ligaments were isolated away from each of the ureters.  The infundibulopelvic ligaments were then clamped, sharply divided, and free  tied with 0 Vicryl followed by a suture ligature of the same with  hemostasis.  The uterine arteries were then skeletonized bilaterally,  clamped, sharply divided and suture ligated with 0 Vicryl.  The remainder of  the cardinal ligaments and uterosacral ligaments were then clamped, sharply  divided and suture ligated with transfixing sutures of 0 Vicryl.  Curved  Haney clamps were then placed at the base of the cervix and the cervix was  sharply circumscribed from the vaginal apex.  The specimen was sent to  pathology.  Transfixing sutures of 0 Vicryl were placed at the apices of the  vagina.  The remainder of the vagina was closed with two figure-of-eight  sutures of 0 Vicryl.  The pelvis was irrigated and suction and the operative  sites were reexamined and found to be hemostatic.  Each of the ureters were explored further.  The right ureter was noted to peristalses.   All of the lap  pads and the self retaining retractors were removed, and the  abdomen was closed.  The peritoneum was closed with a running suture of 2-0  Vicryl.  The fascia was closed with a running suture of 0 PDS.  The  subcutaneous tissue was closed with interrupted sutures of 2-0 plain  sutures.  The skin was closed with staples, and a sterile bandage was placed  over this.  The patient was cleansed with remaining Betadine.   The patient was extubated and escorted to the recovery room in stable and  awake condition.  There were no complications to the procedure.  All needle,  instrument and sponge counts were correct.                                               Randye Lobo, M.D.    BES/MEDQ  D:  02/06/2003  T:  02/06/2003  Job:  [id number]   cc:   Excell Seltzer. Annabell Howells, M.D.  [address]  [place]  [place] [address]  Fax: [phone]   Bethann Berkshire, M.D.  [facility]

## 2010-06-25 NOTE — Op Note (Signed)
NAMEANDERSON, COPPOCK                 ACCOUNT NO.:  [id number]   MEDICAL RECORD NO.:  [id number]          PATIENT TYPE:  AMB   LOCATION:  DAY                          FACILITY:  [facility]   PHYSICIAN:  Jene Every, M.D.    DATE OF BIRTH:  1955-05-14   DATE OF PROCEDURE:  03/10/2005  DATE OF DISCHARGE:                                 OPERATIVE REPORT   PREOPERATIVE DIAGNOSIS:  Spinal stenosis, herniated nucleus pulposus at L5-  S1, left, status post lumbar fusion at 4-5.   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   PROCEDURE PERFORMED:  1.  Lateral recess decompression, foraminotomy of L5 on the left.  2.  Partial excision, posterior element of L5.  3.  Exploration of the fusion at L4-5.   ANESTHESIA:  General.   ASSISTANT:  Georges Lynch. Gioffre, M.D.   BRIEF HISTORY/INDICATIONS:  This is a 55 year old female who is status post  a Ray cage fusion at 4-5.  Done well following that.  Severe L5  radiculopathy severe a presumed foraminal disk herniation seen on CT  myelogram and MRI.  EHL weakness, positive neural tension signs, refractory  to conservative treatment.  Unable to undergo corticosteroid injections due  to allergy.  Operative intervention is indicated for decompression of the 5  root, lumbar  diskectomy, foraminotomy.  It was noted that she had partial  excision of the lamina of 4 and 5 from the previous fusion.  The fusion  appeared to be solid.  Had evidence of motion on x-rays.   TECHNIQUE:  Patient in the supine position.  After the induction of adequate  general anesthesia and 2 gm of Kefzol, she is placed prone on the Gilchrist  frame.  All bony prominences are well padded.  The lumbar region is prepped  and draped in the usual sterile fashion.  Two 18 gauge spinal needle were  utilized to localize the region over the L5-S1 space.  Confirmed with x-ray.  An incision was made from the residual  spinous process of L5-1.  Subcutaneous tissue was dissected.  Electrocautery was utilized to achieve  hemostasis.  The dorsal lumbar fascia identified and divided in line with  the skin incision, palpating the residual of the spinous process of 5 and of  S1.  Elevated the paraspinous musculature of S1 and over the lamina of 5.  There was fibrotic tissue noted here.  The proximal calcification of the  spinous process and interspinous ligament, a portion of the posterior  element was excised.  Placed a Insurance claims handler and then a Penfield 4 in  the interlaminar space, confirmed the space.  After Kochers were placed on  the lamina of the spinous process of 5, there was no motion noted at the  fusion.  A high speed bur was utilized to remove a portion of the facet in  the inferior lamina of 5.  It was expanded with a 2 mm Kerrison.  The root  of the ligamentum flavum from the cephalad edge of S1 performed  in  foraminotomy of S1, decompressing out to the medial border of the pedicle.  Removed the superior articulating process of S1, uncovering the 5 root.  There was __________ of the ligamentum flavum and bony spurring, creating  compression on the 5 root.  This was decompressed.  Bipolar electrocautery  was utilized to achieve hemostasis.  There was an epidural venous plexus as  well.  We followed the 5 root proximal to the thecal sac, decompressing it  and freeing up the root.  Examined the disk under the operating microscope,  which has been used throughout the decompression.  It was a hard disk.  There was a slight listhesis noted at this level.  There was no mobility.  We attempted to incise the annulus.  This was, again, an ossified disk.  It  was felt that this was not causing static compression.  As soon as the  foraminotomy was performed, ligamentum flavum removed, we passed a hockey  stick probe out through the foramen of 5, tracking the 5 root, and it was  well decompressed with  adequate room.  I also tracked it proximally to  beneath the thecal sac and cephalad, where it was fully free, as was the S1  nerve root.  Good excursion once the epidural venous plexus was lysed as  well.  Placed bone wax on the cancellous surfaces.  Copiously irrigated with  antibiotic irrigation.  Inspection revealed no evidence of CSF leakage or  active bleeding.  Again, a Kocher was placed on the lamina of 5 by the  facet.  There was no active motion of 5 either.  There was considerable scar  tissue that was encountered, even over the space at 5-1.  This was mobilized  and excised.  Next, the wound was copiously irrigated.  McCullough retractor  was removed.  Paraspinous muscle was inspected.  No evidence of active  bleeding.  The dorsal lumbar fascia was reapproximated with #1 Vicryl  interrupted figure-of-eight sutures. The subcutaneous tissue was  reapproximated with 0 and 2-0 Vicryl simple sutures.  The skin was  reapproximated with staples.  The wound was dressed sterilely.  She is  placed supine on the hospital bed, extubated without difficulty, and  transported to the recovery room in satisfactory condition.   Patient tolerated the procedure well.  No complications.  Minimal blood  loss.      Jene Every, M.D.  Electronically Signed     JB/MEDQ  D:  03/10/2005  T:  03/10/2005  Job:  [id number]

## 2010-06-25 NOTE — Op Note (Signed)
NAMECASHE, GATT                           ACCOUNT NO.:  [id number]   MEDICAL RECORD NO.:  [id number]                   PATIENT TYPE:  AMB   LOCATION:  [facility]                                 FACILITY:  [facility]   PHYSICIAN:  Excell Seltzer. Annabell Howells, M.D.                 DATE OF BIRTH:  [birth year]   DATE OF PROCEDURE:  11/14/2002  DATE OF DISCHARGE:                                 OPERATIVE REPORT   PROCEDURE:  SPARC sling.   PREOPERATIVE DIAGNOSIS:  Stress incontinence.   POSTOPERATIVE DIAGNOSIS:  Stress incontinence.   SURGEON:  Excell Seltzer. Annabell Howells, M.D.   ANESTHESIA:  General.   DRAINS:  Foley catheter and vaginal pack.   COMPLICATIONS:  None.   INDICATIONS:  Mrs. Anglemyer is a 55 year old white female with stress  incontinence who has elected a sling for correction.   DESCRIPTION OF PROCEDURE:  The patient is given p.o. antibiotics.  She was  taken to the operating room where general anesthetic was induced.  She was  placed in the lithotomy position.  Her mons was shaved.  She was prepped  with Betadine solution and she was draped in the usual sterile fashion.  A  Foley catheter was inserted.  The bladder was drained.  Two incisions were  made over the pubic bone, one on the right and one on the left,  approximately 2 cm lateral to the midline, and the anterior vaginal wall was  then infiltrated with 5 mL of 1% lidocaine with epinephrine and an incision  was made over the mid urethra through the vaginal mucosal wall down to the  pubic urethral fascia.  The mucosa was elevated off the fascia for  approximately 2 cm on each side to allow placement of a finger.  The [facility]  trocars were then passed first to the abdominal incision down to the pubis.  The tip was walked along the back of the pubis until it could be felt by a  finger in the right vaginal incision which was also used to hold the urethra  out of the line of the trocar.  The trocar was then passed through into the  vaginal  incision.  This was repeated on the left side.  Cystoscopy was  performed and no evidence of bladder wall injury was noted.  The bladder was  drained.  The [facility] mesh was snapped at the trocars and pulled into the  abdominal incision.  Cystoscopy was repeated.  Once again, no bladder wall  injury was noted.  The bladder was left full at this point.  The mesh sheath  was removed and the position of the sling was adjusted until there was  minimal alteration in urine flow with pressure on the bladder.  At this  point the anterior vaginal wall was closed using a running locked 2-0 Vicryl  suture.  A vaginal pack was placed of two-inch Iodoform  gauze.  The long  abdominal ends of the sling were trimmed to allow the edges to fall back  into the subcutaneous space and the wound was cleansed, dried, prepped  with tincture of Benzoin and Steri-Strips were used to close the abdominal  incisions.  At this point the Foley catheter was replaced and placed to  straight drainage.  The patient was taken down from the lithotomy position.  Her anesthetic was reversed.  She was removed to the recovery room in stable  condition.  There were no complications.                                                    Excell Seltzer. Annabell Howells, M.D.    JJW/MEDQ  D:  11/14/2002  T:  11/14/2002  Job:  [id number]

## 2010-06-25 NOTE — Op Note (Signed)
NAMEFATMATA, LEGERE                 ACCOUNT NO.:  [id number]   MEDICAL RECORD NO.:  [id number]          PATIENT TYPE:  AMB   LOCATION:  [facility]                         FACILITY:  [facility]   PHYSICIAN:  Jene Every, M.D.    DATE OF BIRTH:  11-Jan-1956   DATE OF PROCEDURE:  DATE OF DISCHARGE:                                 OPERATIVE REPORT   PREOPERATIVE DIAGNOSIS:  Rotator cuff tear impingement syndrome, right  shoulder.   POSTOPERATIVE DIAGNOSES:  1.  Rotator cuff tear impingement syndrome, right shoulder.  2.  Adhesive capsulitis.   PROCEDURE PERFORMED:  Right  rotator cuff repair, subacromial decompression,  lysis of adhesions.   ANESTHESIA:  General anesthesia.   ASSISTANT:  Roma Schanz, P.A.   BRIEF HISTORY:  A 55 year old with refractory shoulder pain, MRI indicating  rotator cuff tear, full thickness, anterolateral acromial spur.  Operative  intervention was indicated for decompression of subacromial joint, removal  of the spur and  rotator cuff repair.  Evaluation under anesthesia, risks  and benefits discussed, bleeding, infection, damage to __________ suboptimal  range of motion, recurrent tear, arthrofibrosis, etc.   DESCRIPTION OF PROCEDURE:  Patient supine beach chair position.  After  induction of general anesthesia and 1 g of Kefzol, the right upper extremity  was prepped and draped in the usual sterile fashion.  Patient had an  extremely adipose tissue over the anterior deltoid.  We demarcated the  acromion utilizing a 22-gauge needle.  Incision was made over the anterior  aspect of the acromion.  Subcutaneous tissue was dissected.  Electrocautery  was utilized to achieve hemostasis.  Raphe between the anterior lateral head  so the acromion was identified, developed, divided with subperiosteal  elevator over the anterior lateral aspect of the acromion and anterior  medially.  CA ligament was divided and detached from the acromion.  There  was an anterior  lateral spur of the acromion.  This was removed with an  oscillating saw contoured with a high speed bur and a Beyer rongeur.  Following this decompression, I digitally lysed multiple adhesions in the  subacromial space and swept the subdeltoid region anterior laterally and  posteriorly.  Following this, I evaluated the rotator cuff and at the  insertion of supraspinatus, there was a U-shaped attachment type tear with  devitalized tissue.  This was debrided.  It was only  a centimeter or so in  length and width.  Following this, I decorticated beneath it with a Beyer  rongeur and a curette.  Utilizing two Mitek suture anchors and then debrided  the rotator cuff into that trough.  I then oversewed the lateral aspect of  the supraspinatus and the drill hole with #1 Ethibond through a drill hole  in the lateral cortex of the humerus.  Secure watertight fit had been  obtained.  The rotation without compromise of the repair.  There was no  impingement with range of motion and good range of motion noted.  Wound  copiously irrigated with antibiotic irrigation.  Raphe was repaired with #1  Vicryl and interrupted figure-of-eight sutures through and  on top of the  acromion.  Subcutaneous tissue reapproximated with 2-0 Vicryl subcuticular,  skin reapproximated with 4-0 subcuticular Prolene.  Wound was  __________ Steri-Strips and sterile dressing applied.  Placed supine on the  hospital bed, extubated without difficulty and transported to the recovery  room in satisfactory condition.  She was placed on an abduction pillow.   The patient tolerated the procedure well with no complications.      JB/MEDQ  D:  05/12/2004  T:  05/12/2004  Job:  [id number]

## 2010-06-25 NOTE — Discharge Summary (Signed)
NAME:  Marisa Ryan, Marisa Ryan                           ACCOUNT NO.:  [id number]   MEDICAL RECORD NO.:  [id number]                   PATIENT TYPE:  INP   LOCATION:  9306                                 FACILITY:  [facility]   PHYSICIAN:  Randye Lobo, M.D.                DATE OF BIRTH:  1955/12/10   DATE OF ADMISSION:  02/06/2003  DATE OF DISCHARGE:                                 DISCHARGE SUMMARY   ADMISSION DIAGNOSES:  1. Left adnexal mass.  2. Uterine leiomyomata.  3. History of abnormal Pap smears.  4. Congenitally nonfunctioning left kidney.   DISCHARGE DIAGNOSES:  1. Status post exploratory laparotomy, total abdominal hysterectomy with     bilateral salpingo-oophorectomy, lysis of adhesions, and pelvic washings.  2. Congenitally nonfunctional left kidney.  3. Probable dilated left ureter.   SIGNIFICANT OPERATIONS AND PROCEDURES:  The patient underwent an exploratory  laparotomy with a total abdominal hysterectomy and bilateral salpingo-  oophorectomy with pelvic washings and lysis of adhesions at the [facility] [place] on February 06, 2003 under the direction of Dr. Conley Simmonds and with the assistance of Dr. Lodema Hong.   ADMISSION HISTORY AND PHYSICAL EXAMINATION:  The patient is a 55 year old  gravida 2 para 2 Caucasian female status post bilateral tubal ligation who  presented for evaluation of a left adnexal mass which was noted on CT scan  ordered to rule out a pulmonary embolus.  The patient indeed did not have a  pulmonary embolus. Pelvic ultrasound documented a cystic lesion in the left  adnexal region measuring 5.4 cm.  The patient reported that she had been  having left-sided pain.  In addition, the patient had a medical history  which was significant for a recent suburethral sling for urinary  incontinence in October 2004, chronic back pain with a previous spinal  fusion of L4 to L5, and a congenitally nonfunctional left kidney.   On physical examination the  patient was noted to have normal external  genitalia and a normal urethra.  The cervix and vagina demonstrated no  lesions.  The uterus was noted to be small and nontender and no palpable  adnexal masses were appreciated.  Rectovaginal examination confirmed the  bimanual exam and the stool was noted to be guaiac negative.   HOSPITAL COURSE:  The patient presented to the [facility] of  [place] on February 06, 2003 at which time she underwent an exploratory  laparotomy with total abdominal hysterectomy, bilateral salpingo-  oophorectomy, pelvic washings, and lysis of adhesions.  Significant  intraoperative findings included a normal uterus and ovaries.  The fallopian  tubes were consistent with a prior bilateral tubal ligation.  There was no  palpable left kidney.  The left ureter appeared to be dilated in its usual  retroperitoneal position along the left pelvic sidewall.  The right kidney  and right ureter were noted to be normal.  The surgical procedure was  uncomplicated.   Postoperatively the patient had an unremarkable course.  Her pain was  controlled initially by a morphine PCA and Toradol.  She was successfully  converted over to Percocet and ibuprofen which controlled her pain well.  The patient's diet was advanced to normal during her hospitalization and she  was tolerating this well.  The patient had return of bowel function and had  a bowel movement during the hospitalization.  The Foley catheter was removed  on postoperative day #1 and the patient was able to void without difficulty.  The patient was able to ambulate independently.   The patient is found to be in good condition and ready for discharge on  postoperative day #2.  Her discharge hematocrit is 38.7%.  The final  pathology report is pending at the time of her discharge.   INSTRUCTIONS AT DISCHARGE:  1. Discharge to home.  2. The patient has a prescription for Percocet.  She will take one to two      p.o. q.4-6h. p.r.n.  In addition, the patient will take over-the-counter     ibuprofen 600 mg p.o. q.6h. p.r.n.  3. The patient will follow a regular diet.  4. The patient will use a heating pad to her incision.  She will follow up     on February 10, 2003 for staple removal.  5. The patient will have decreased activity for the next six weeks.  6. The patient will call if she experiences fever, nausea and vomiting, pain     uncontrolled by her medication, redness or drainage from her incision, or     any other concern.                                               Randye Lobo, M.D.    Hervey Ard  D:  02/08/2003  T:  02/08/2003  Job:  [id number]

## 2010-06-25 NOTE — Consult Note (Signed)
NAMEKRYSTALE, Marisa Ryan                 ACCOUNT NO.:  [id number]   MEDICAL RECORD NO.:  [id number]          PATIENT TYPE:  EMS   LOCATION:  ED                           FACILITY:  [facility]   PHYSICIAN:  Jordan Hawks. Elnoria Howard, MD    DATE OF BIRTH:  April 01, 1955   DATE OF CONSULTATION:  01/03/2006  DATE OF DISCHARGE:                                 CONSULTATION   REASON FOR CONSULTATION:  Dysphagia.   PRIMARY CARE PHYSICIAN:  Tammy R. Collins Scotland, M.D.   HISTORY OF PRESENT ILLNESS:  This is a 55 year old female who states she  had an acute onset of dysphagia that started at 5:15 p.m. this evening.  The patient was eating dinner at that time and she had ingested a bolus  of pork, and suddenly developed difficulty swallowing.  Since that  event, she was not able to swallow properly and had difficulty with  tolerating secretions.  She had repeated vomiting and subsequently  presented to the emergency room for further evaluation and treatment.  The patient denies any history of dysphagia in the past.  She does have  a history of gastroesophageal reflux disease which is usually resolved  with over-the-counter medications; however, she does report having a  worsening of the symptoms.  Additionally she does have some nocturnal  symptoms.  No reports of any hematemesis.   PAST MEDICAL/SURGICAL HISTORY:  1. Significant for a carpal tunnel release.  2. Osteoporosis.  3. One functioning kidney.  4. Chronic urinary tract infections.  5. Status post hysterectomy in December 2003.   MEDICATIONS:  1. Vicodin.  2. Flexeril.  3. Actonel.  4. Vitamin D.  5. Calcium.  6. Macrodantin.   ALLERGIES:  CORTISONE, which results in shortness of breath.   REVIEW OF SYSTEMS:  Negative for any dizziness, fevers, chills,  abdominal pain.  Positive for some chest pressure.  No diarrhea or  constipation, hematuria, hematemesis, dysarthria or new neurologic  complaints.   FAMILY HISTORY:  Noncontributory.   SOCIAL  HISTORY:  Negative for alcohol, tobacco, illicit drug use.  She  is married.   PHYSICAL EXAMINATION:  VITAL SIGNS:  Blood pressure 151/97, heart rate  84, respirations 18, pulse oximetry 98% on room air.  Weight is 220  pounds, height 5 feet 2 inches.  GENERAL:  The patient is in acute distress.  She has vomiting episodes.  HEENT:  Normocephalic and atraumatic.  Extraocular muscles intact.  Pupils equal, round, reactive to light.  NECK:  Supple, no lymphadenopathy.  LUNGS:  Clear to auscultation bilaterally.  CARDIOVASCULAR:  A regular rate and rhythm.  ABDOMEN:  Obese, soft, nontender, non-distended.  EXTREMITIES:  No clubbing, cyanosis or edema.   LABORATORY DATA:  Pending at this time.   IMPRESSION:  Acute dysphagia.  This is most likely secondary to food  impaction.  She does have a history of gastroesophageal reflux disease.  Additionally, the patient is on the medication called Actonel and she  believes her reflux symptoms have worsened since starting the  medication.  It is possible that she may have a pill-induced  esophagitis.  PLAN:  At this time is to perform an emergent EGD for disimpaction of  the food bolus.      Jordan Hawks Elnoria Howard, MD  Electronically Signed     PDH/MEDQ  D:  01/03/2006  T:  01/04/2006  Job:  [id number]   cc:   Tammy R. Collins Scotland, M.D.  Fax: [phone]
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
## 2010-06-25 NOTE — Procedures (Signed)
[facility]  Patient:    Marisa Ryan                        MRN: [id number] Proc. Date: 10/04/99 Adm. Date:  [id number] Attending:  Thyra Breed CC:         Reinaldo Meeker, M.D.   Procedure Report  PROCEDURE:  Lumbar epidural steroid injection.  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  HISTORY OF PRESENT ILLNESS:  Marisa Ryan is a very pleasant 55 year old who was sent to [place] by Dr. Gerlene Fee for a series of 2 lumbar epidural steroid injections. The patient stated she was in her usual state of health up until June 2001 when she had a work related injury while moving boxes. At that time, she had the onset of lower back discomfort radiating out into the left lower extremity. She was seen in the emergency room at [facility] and eventually sent to occupational medicine where she was treated by Dr. Kathrynn Running with muscle relaxants and analgesics. In mid July, she was seen by Dr. Gerlene Fee who arranged for a myelogram which was performed on August 30, 1999 which showed disk space narrowing with focal left sided proliferative changes in the neuroforamen. She also had disk bulges at L5-S1. Based on these findings and her failure to respond to conservative management, she is sent for a series of epidurals. She describes her pain as a constant dull ache in her lower back with numbness and tingling out into the left lower extremity involving the left great toe. It is made worse by standing and sitting and improved by laying on her side with pillows between her legs. She has cramping in her left lower extremity at times and numbness and tingling which is pretty persistent. She stated that she has some mild weakness in her left lower extremity. She denied any bowel or bladder changes. She has been treated with Darvocet and Flexeril recently which she states takes the edge off but does not resolve the pain.  CURRENT MEDICATIONS:   Darvocet and Flexeril as well as Premarin and Provera.  ALLERGIES:  CONTRAST DYES which was discovered on myelogram and question of HYDROCODONE allergy.  FAMILY HISTORY:  Positive for coronary artery disease and diabetes.  ACTIVE MEDICAL PROBLEMS:  Menopause and single functioning kidney.  PAST SURGICAL HISTORY:  Significant for a cesarean section x 2, back surgeries x 2 in the early 1990s which resulted in resolution in her discomfort, tonsillectomy and what sounds like a lipoma resection.  SOCIAL HISTORY:  The patient works for [employer]. She does not drink alcohol or smoke cigarettes.  REVIEW OF SYSTEMS:  GENERAL:  Significant for menopausal changes otherwise negative. HEAD:  Negative.  EYES:  Negative. NOSE/MOUTH/THROAT:  Negative. EARS:  Negative. PULMONARY:  Negative.  GI:  Significant for some mild gastroesophageal reflux. GU: See active medical problems. MUSCULOSKELETAL/NEUROLOGIC:  See HPI. No history of seizure or stroke. HEMATOLOGIC:  Negative. ENDOCRINE:  Negative. CUTANEOUS:  Negative. PSYCHIATRIC:  Negative. ALLERGIES:  Positive to dye allergy characterized by hives and angioedema.  PHYSICAL EXAMINATION:  VITAL SIGNS:  The patient is afebrile with vital signs stable. Please see flow sheet.  GENERAL:  This is an obese female in no acute distress.  HEENT:  Head was normocephalic, atraumatic. Eyes extraocular movements intact with conjunctivae and sclerae clear. Nose patent nares. Oropharynx was free of lesions.  NECK:  Supple without lymphadenopathy. Carotids are 2+ and symmetric without bruits.  LUNGS:  Clear.  HEART:  Regular rate and rhythm.  BREASTS/ABDOMINAL/RECTAL/GENITALIA:  Not performed.  BACK:  Revealed a well healed surgical scar with tenderness in the paralumbar regions in the lumbosacral junction suggestive of some facet joint tenderness which was increased by hyperextension and to an extent reduced by forward flexion. Straight leg raise signs  are negative.  EXTREMITIES:  No cyanosis, clubbing or edema with radial pulses and dorsalis pedis pulse 2+ and symmetric.  NEUROLOGIC:  The patient was oriented x 4. Cranial nerves II-XII are grossly intact. Deep tendon reflexes were symmetric in the upper and lower extremities with down going toes. Motor was significant for some mild weakness of the left great toe to dorsiflexion with symmetric bulk and tone. Sensory was intact to vibratory sense and pinprick. Coordination was grossly intact.  IMPRESSION: 1. Low back pain with previous history of surgical intervention with    resolution in that discomfort with a new pain following a work related    injury with MRI demonstrating lumbar spondylosis with neuroforaminal    stenosis. 2. Single functioning kidney. 3. Mild gastroesophageal reflux disease. 4. Allergy to contrast dye.  DISPOSITION:  I discussed the potential risks, benefits and limitations of a lumbar epidural steroid injection as well as the side effects of corticosteroids in detail with the patient and her husband. They are interested in pursuing an epidural today.  DESCRIPTION OF PROCEDURE:  After informed consent was obtained, the patient was placed in the sitting position and monitored. The patients back was prepped with Betadine x 3. A skin wheal was raised at the L4-5 interspace with 1 percent lidocaine. A 20 gauge Tuohy needle was introduced to the lumbar epidural space to loss of resistance to preservative free normal saline. There was no cerebrospinal fluid nor blood. The depth was 7 cm. 80 mg of Medrol and 7 ml of preservative free normal saline was gently injected. The needle was flushed with preservative free normal saline and removed intact.  CONDITION POST PROCEDURE:  Stable.  DISCHARGE INSTRUCTIONS:  Resume previous diet. Limitations in activities per instruction sheet. Continue on current medications. Follow-up with me in 1-2 weeks for a second  injection. DD:  10/04/99 TD:  10/04/99 Job: [id number]

## 2010-06-25 NOTE — Procedures (Signed)
[facility]  Patient:    Marisa Ryan, Marisa Ryan                        MRN: [id number] Proc. Date: 10/13/99 Adm. Date:  [id number] Attending:  Thyra Breed CC:         Reinaldo Meeker, M.D.   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  PROCEDURE:  Lumbar epidural steroid injection.  SURGEON:  Thyra Breed, M.D.  INTERVAL HISTORY:  The patient noted increased leg cramps after her last injection and very minimal improvement overall with her pain.  She still wishes to go ahead with her second injection today.  PHYSICAL EXAMINATION:  Blood pressure is 118/68, heart rate 79, respiratory rate 16, and O2 saturations 95%.  Temperature is 97.1.  Pain level is 5 out of 10.  She shows good healing from previous injection site.  DESCRIPTION OF PROCEDURE:  After informed consent was obtained, the patient was placed in the sitting position and monitored.  Her back was prepped with Betadine x 3.  Skin level was raised at the L4-5 interspace with 1% lidocaine. A 20-gauge Tuohy needle was introduced into the lumbar epidural space with loss of resistance to preservative-free normal saline.  There was no CSF and no blood.  Forty milligrams of Medrol and 10 ml of preservative-free normal saline were gently injected.  The needle was flushed with preservative-free normal saline and removed intact.  POSTPROCEDURE CONDITION:  Stable.  DISCHARGE INSTRUCTIONS: 1. Resume previous diet. 2. Limitation and activities per instruction sheet. 3. Continue on current medications. 4. The patient plans to follow up with Dr. Gerlene Fee. DD:  10/13/99 TD:  10/14/99 Job: [id number]

## 2010-06-25 NOTE — H&P (Signed)
NAME:  Marisa Ryan, Marisa Ryan                           ACCOUNT NO.:  [id number]   MEDICAL RECORD NO.:  [id number]                   PATIENT TYPE:  INP   LOCATION:  NA                                   FACILITY:  [facility]   PHYSICIAN:  Randye Lobo, M.D.                DATE OF BIRTH:  [birth year]   DATE OF ADMISSION:  02/05/2003  DATE OF DISCHARGE:                                HISTORY & PHYSICAL   CHIEF COMPLAINT:  Left adnexal mass.   HISTORY OF PRESENT ILLNESS:  The patient is a 55 year old gravida 2, para 2  Caucasian female, status post bilateral tubal ligation, who presented to the  office from her primary care physician for evaluation of a left adnexal  mass, which was noted on CT scan. The patient had a CT scan of the chest  performed on February 11, 2002 when she presented with lower extremity edema  and chest pain. The CT scan performed at [facility] documented no  evidence of any pulmonary embolus or cardiopulmonary disease. CT scan of the  lower extremities documented no evidence of deep vein thrombosis and there  was mention of a left ovarian cyst measuring 5.5 cm in the largest diameter.  The patient had a pelvic ultrasound performed in followup to this, at which  time small uterine fibroids measuring 0.9 to 1.9 cm in diameter were noted.  The endometrial stripe measured 5 mm. There was a 5.4 cm cystic tubular mass  noted in the left adnexal region, which was thought to be separate from the  left ovary. The right ovary was unremarkable. There was no free fluid in the  pelvis.   The patient has subsequently developed pain in the left side, which wakes  her up at night. The patient declines any future childbearing and she wishes  to proceed with surgical evaluation and treatment.   PAST GYNECOLOGIC HISTORY:  Significant for:  1. Status post cesarean section x2.  2. Status post bilateral tubal ligation.  3. History of both AGUS AND ASCUS pap smears. The patient underwent   colposcopy February 23, 1999, which documented cervical atypia with a     negative endocervical curettage.   The patient's most recent pap smear on March 01, 2002 was within normal  limits. The patient at this time, is having sporadic menses and she reports  hot flashes. She is not taking any hormonal therapy. Her last mammogram was  performed on December 31, 2002 and is within normal limits.   PAST MEDICAL HISTORY:  1. A congenitally non-functional left kidney.  2. Gastroesophageal reflux disease.   PAST SURGICAL HISTORY:  1. Status post cesarean section x2.  2. Status post bilateral tubal ligation.  3. Status post suburethral sling for urinary incontinence on November 14, 2002.  4. Status post spinal fusion of L4 to L5.  5. The patient has also  had other back surgeries performed in April and in     September of 1991.   MEDICATIONS:  Naproxen p.r.n.   ALLERGIES:  No known drug allergies.   SOCIAL HISTORY:  The patient is married. She does clerical work. She denies  the use of alcohol, tobacco, or illicit drugs.   FAMILY HISTORY:  The patient's mother and father have coronary artery  disease. There is no family history of breast, uterine, ovarian, or colon  cancer.   PHYSICAL EXAMINATION:  VITAL SIGNS:  Blood pressure 112/70. Weight 218  pounds.  GENERAL:  The patient is an overweight Caucasian female in no acute  distress.  HEENT:  Normocephalic, atraumatic.  NECK:  Negative for adenopathy or thyromegaly.  LUNGS:  Clear to auscultation bilaterally.  HEART:  S1 and S2 with a regular rate and rhythm. No evidence of a murmur,  rub or gallop.  ABDOMEN:  Examination is consistent with both an infraumbilical and a lower  abdominal vertical midline incision. There is no evidence of herniation. The  abdomen is soft and nontender and without evidence of hepatosplenomegaly or  organomegaly.  BREAST:  Examination documents no dominant masses, skin retractions, nipple   discharge, or axillary adenopathy.  PELVIC:  Normal external genitalia and urethra. The vagina and cervix  demonstrate no lesions. The uterus is noted to be small and non-tender. No  palpable adnexal masses are appreciated.  RECTAL:  Rectovaginal examination confirms the bimanual examination. The  stool is guaiac negative.   IMPRESSION:  The patient is a 55 year old gravida 2, para 2 peri-menopausal  female, status post bilateral tubal ligation with a left adnexal mass and  uterine fibroids. The patient wishes for definitive surgical evaluation and  treatment of the cyst and her left lower quadrant pain.   The patient has a non-functional left kidney, which is congenital.   The patient is status post suburethral sling.   PLAN:  The patient will undergo a total abdominal hysterectomy with  bilateral salpingo-oophorectomy and pelvic washings at the [facility] [place] on February 06, 2003. Risks, benefits and alternatives have  been discussed with the patient who wishes to proceed.                                               Randye Lobo, M.D.    BES/MEDQ  D:  02/05/2003  T:  02/05/2003  Job:  [id number]

## 2010-10-08 ENCOUNTER — Emergency Department (HOSPITAL_COMMUNITY): Payer: Medicare Other

## 2010-10-08 ENCOUNTER — Emergency Department (HOSPITAL_COMMUNITY)
Admission: EM | Admit: 2010-10-08 | Discharge: 2010-10-08 | Disposition: A | Discharge status: Home / Self Care | Payer: Medicare Other | Attending: Emergency Medicine | Admitting: Emergency Medicine

## 2010-10-08 DIAGNOSIS — K8020 Calculus of gallbladder without cholecystitis without obstruction: Secondary | ICD-10-CM | POA: Insufficient documentation

## 2010-10-08 DIAGNOSIS — R079 Chest pain, unspecified: Secondary | ICD-10-CM | POA: Insufficient documentation

## 2010-10-08 DIAGNOSIS — M549 Dorsalgia, unspecified: Secondary | ICD-10-CM | POA: Insufficient documentation

## 2010-10-08 DIAGNOSIS — R109 Unspecified abdominal pain: Secondary | ICD-10-CM | POA: Insufficient documentation

## 2010-10-08 DIAGNOSIS — N390 Urinary tract infection, site not specified: Secondary | ICD-10-CM | POA: Insufficient documentation

## 2010-10-08 LAB — URINALYSIS, ROUTINE W REFLEX MICROSCOPIC
Glucose, UA: NEGATIVE mg/dL
Hgb urine dipstick: NEGATIVE
Ketones, ur: NEGATIVE mg/dL
Protein, ur: NEGATIVE mg/dL

## 2010-10-08 LAB — DIFFERENTIAL
Basophils Relative: 1 % (ref 0–1)
Eosinophils Relative: 4 % (ref 0–5)
Lymphocytes Relative: 26 % (ref 12–46)
Monocytes Absolute: 0.6 10*3/uL (ref 0.1–1.0)
Monocytes Relative: 9 % (ref 3–12)
Neutro Abs: 3.8 10*3/uL (ref 1.7–7.7)

## 2010-10-08 LAB — COMPREHENSIVE METABOLIC PANEL
ALT: 20 U/L (ref 0–35)
AST: 19 U/L (ref 0–37)
Alkaline Phosphatase: 114 U/L (ref 39–117)
CO2: 24 mEq/L (ref 19–32)
Chloride: 105 mEq/L (ref 96–112)
GFR calc non Af Amer: >60 mL/min (ref >60)
Sodium: 138 mEq/L (ref 135–145)
Total Bilirubin: 0.2 mg/dL — ABNORMAL LOW (ref 0.3–1.2)

## 2010-10-08 LAB — CBC
HCT: 42.1 % (ref 36.0–46.0)
Hemoglobin: 14.3 g/dL (ref 12.0–15.0)
MCH: 29.1 pg (ref 26.0–34.0)
MCHC: 34 g/dL (ref 30.0–36.0)
RDW: 13 % (ref 11.5–15.5)

## 2010-10-08 LAB — URINE MICROSCOPIC-ADD ON

## 2010-10-12 ENCOUNTER — Encounter (INDEPENDENT_AMBULATORY_CARE_PROVIDER_SITE_OTHER): Payer: Self-pay | Admitting: General Surgery

## 2010-10-12 ENCOUNTER — Ambulatory Visit (INDEPENDENT_AMBULATORY_CARE_PROVIDER_SITE_OTHER): Payer: Medicare Other | Admitting: General Surgery

## 2010-10-12 VITALS — BP 134/88 | HR 78 | Temp 96.7°F | Ht 63.0 in | Wt 234.8 lb

## 2010-10-12 DIAGNOSIS — K8020 Calculus of gallbladder without cholecystitis without obstruction: Secondary | ICD-10-CM

## 2010-10-12 NOTE — Progress Notes (Signed)
Chief Complaint  Patient presents with  . Other    Eval gallbladder with stones - Dr. Donnetta Hutching @[facility]  ER    HPI Marisa Ryan is a 55 y.o. female. This patient was referred by Dr. Adriana Simas at the emergency room for evaluation of right-sided abdominal and back pain. She states that she has been having right-sided shoulder and back pain for a proximally 6-8 months until recently she awoke from sleep with severe right-sided back pain which she describes as "stabbing". She went to the emergency room and received some pain medication and symptoms improved although she is still having some mild back pain. In the emergency room she had an ultrasound which demonstrated multiple gallstones within the gallbladder as well as a 9 mm common bile duct. She takes some oxycodone for relief of this. She states that her mother and sister also had gallbladder problems. She denies any fevers or chills but she does have a significant amount of nausea after eating and she states that the back pain and nausea are worse with food. She denies any significant abdominal pain except with palpation in the right upper quadrant. She has some occasional diarrhea she denies any blood in stools or melena and she had a normal colonoscopy roughly 5 years ago. She also has a history of reflux but she has been getting good control with daily Nexium.Her LFTs were normal. HPI  Past Medical History  Diagnosis Date  . Hypertension     Patient only takes medication when needed.  Marland Kitchen UTI (urinary tract infection)     history of them    Past Surgical History  Procedure Date  . Abdominal hysterectomy 01/2003  . Bladder suspension 11/2002  . Removal of kidney 2008    left    History reviewed. No pertinent family history.  Social History History  Substance Use Topics  . Smoking status: Former Smoker    Quit date: 05/24/1991  . Smokeless tobacco: Not on file  . Alcohol Use: No    Allergies  Allergen Reactions  . Cortisone  Anaphylaxis    Felt hot. Turned face red.  Berle Mull Dye (Iodinated Diagnostic Agents) Anaphylaxis    Current Outpatient Prescriptions  Medication Sig Dispense Refill  . ciprofloxacin (CIPRO) 750 MG tablet Take 750 mg by mouth 2 (two) times daily. Patient to confirm on next visit.       Marland Kitchen esomeprazole (NEXIUM) 20 MG capsule Take 20 mg by mouth 2 (two) times daily.        . OXYCODONE HCL PO Take by mouth as needed. Patient does not remember dosage.         Review of Systems Review of Systems  All other systems reviewed and are negative.    Blood pressure 134/88, pulse 78, temperature 96.7 F (35.9 C), temperature source Temporal, height 5\' 3"  (1.6 m), weight 234 lb 12.8 oz (106.505 kg).  Physical Exam Physical Exam  Constitutional: She is oriented to person, place, and time. She appears well-developed and well-nourished. No distress.  HENT:  Head: Normocephalic and atraumatic.  Eyes: Conjunctivae and EOM are normal. Pupils are equal, round, and reactive to light. Right eye exhibits no discharge. Left eye exhibits no discharge. No scleral icterus.  Neck: Normal range of motion. Neck supple. No tracheal deviation present.  Cardiovascular: Normal rate, regular rhythm and normal heart sounds.   Pulmonary/Chest: Effort normal and breath sounds normal. No stridor. No respiratory distress. She has no wheezes.  Abdominal: Soft. Bowel sounds are normal.  She exhibits no distension and no mass. There is tenderness. There is no rebound and no guarding.       Mild RUQ tenderness with palpation but no Murphy's or peritoneal signs.  Musculoskeletal: Normal range of motion. She exhibits no edema and no tenderness.  Neurological: She is alert and oriented to person, place, and time.  Skin: Skin is warm and dry. No rash noted. She is not diaphoretic. No erythema. No pallor.  Psychiatric: She has a normal mood and affect. Her behavior is normal. Judgment and thought content normal.     Assessment      Cholelithiasis  She is having intermittent back pain and right upper quadrant pain with palpation and she does have documented gallstones on ultrasound. Her symptoms are exacerbated by food and is most likely due to symptomatic cholelithiasis. I discussed with her the options for continued observation versus cholecystectomy and she elected proceed with cholecystectomy. I discussed with her the risks of the procedure including infection, bleeding, pain, scarring persistent symptoms, injury to bowel or bile ducts, diarrhea. She expressed understanding and desires to proceed with cholecystectomy.    Plan    We will plan for laparoscopic cholecystectomy with intraoperative cholangiogram given her enlarged and dilated bile duct. Her LFTs were normal.       Chika Cichowski DAVID 10/12/2010, 11:14 AM

## 2010-10-19 ENCOUNTER — Encounter (INDEPENDENT_AMBULATORY_CARE_PROVIDER_SITE_OTHER): Payer: Medicare Other | Admitting: Surgery

## 2010-10-27 ENCOUNTER — Other Ambulatory Visit: Payer: Self-pay | Admitting: Anesthesiology

## 2010-10-27 ENCOUNTER — Other Ambulatory Visit (INDEPENDENT_AMBULATORY_CARE_PROVIDER_SITE_OTHER): Payer: Self-pay | Admitting: General Surgery

## 2010-10-27 ENCOUNTER — Encounter (HOSPITAL_COMMUNITY): Payer: Medicare Other

## 2010-10-27 ENCOUNTER — Ambulatory Visit (HOSPITAL_COMMUNITY)
Admission: RE | Admit: 2010-10-27 | Discharge: 2010-10-27 | Disposition: A | Discharge status: Home / Self Care | Payer: Medicare Other | Source: Ambulatory Visit | Attending: General Surgery | Admitting: General Surgery

## 2010-10-27 DIAGNOSIS — Z01810 Encounter for preprocedural cardiovascular examination: Secondary | ICD-10-CM | POA: Insufficient documentation

## 2010-10-27 DIAGNOSIS — Z01811 Encounter for preprocedural respiratory examination: Secondary | ICD-10-CM

## 2010-10-27 DIAGNOSIS — Z01812 Encounter for preprocedural laboratory examination: Secondary | ICD-10-CM | POA: Insufficient documentation

## 2010-10-27 DIAGNOSIS — Z01818 Encounter for other preprocedural examination: Secondary | ICD-10-CM | POA: Insufficient documentation

## 2010-10-27 DIAGNOSIS — K8020 Calculus of gallbladder without cholecystitis without obstruction: Secondary | ICD-10-CM | POA: Insufficient documentation

## 2010-10-27 LAB — BASIC METABOLIC PANEL
BUN: 20 mg/dL (ref 6–23)
Chloride: 104 mEq/L (ref 96–112)
GFR calc Af Amer: >60 mL/min (ref >60)
Potassium: 4.6 mEq/L (ref 3.5–5.1)
Sodium: 139 mEq/L (ref 135–145)

## 2010-10-27 LAB — CBC
HCT: 41.5 % (ref 36.0–46.0)
Hemoglobin: 14.1 g/dL (ref 12.0–15.0)
RDW: 12.9 % (ref 11.5–15.5)
WBC: 7 10*3/uL (ref 4.0–10.5)

## 2010-10-27 LAB — SURGICAL PCR SCREEN: Staphylococcus aureus: NEGATIVE

## 2010-11-01 ENCOUNTER — Other Ambulatory Visit (INDEPENDENT_AMBULATORY_CARE_PROVIDER_SITE_OTHER): Payer: Self-pay | Admitting: General Surgery

## 2010-11-01 ENCOUNTER — Observation Stay (HOSPITAL_COMMUNITY)
Admission: RE | Admit: 2010-11-01 | Discharge: 2010-11-02 | Disposition: A | Discharge status: Home / Self Care | Payer: Medicare Other | Source: Ambulatory Visit | Attending: General Surgery | Admitting: General Surgery

## 2010-11-01 DIAGNOSIS — Z01818 Encounter for other preprocedural examination: Secondary | ICD-10-CM | POA: Insufficient documentation

## 2010-11-01 DIAGNOSIS — Z91041 Radiographic dye allergy status: Secondary | ICD-10-CM | POA: Insufficient documentation

## 2010-11-01 DIAGNOSIS — Z01810 Encounter for preprocedural cardiovascular examination: Secondary | ICD-10-CM | POA: Insufficient documentation

## 2010-11-01 DIAGNOSIS — K429 Umbilical hernia without obstruction or gangrene: Secondary | ICD-10-CM | POA: Insufficient documentation

## 2010-11-01 DIAGNOSIS — K8010 Calculus of gallbladder with chronic cholecystitis without obstruction: Principal | ICD-10-CM | POA: Insufficient documentation

## 2010-11-01 DIAGNOSIS — Z01812 Encounter for preprocedural laboratory examination: Secondary | ICD-10-CM | POA: Insufficient documentation

## 2010-11-01 HISTORY — PX: LAPAROSCOPIC CHOLECYSTECTOMY: SUR755

## 2010-11-01 HISTORY — PX: UMBILICAL HERNIA REPAIR: SHX196

## 2010-11-04 ENCOUNTER — Ambulatory Visit (INDEPENDENT_AMBULATORY_CARE_PROVIDER_SITE_OTHER): Payer: Medicare Other | Admitting: General Surgery

## 2010-11-04 ENCOUNTER — Encounter (INDEPENDENT_AMBULATORY_CARE_PROVIDER_SITE_OTHER): Payer: Self-pay | Admitting: General Surgery

## 2010-11-04 ENCOUNTER — Other Ambulatory Visit (INDEPENDENT_AMBULATORY_CARE_PROVIDER_SITE_OTHER): Payer: Self-pay | Admitting: General Surgery

## 2010-11-04 VITALS — BP 128/88 | HR 80 | Temp 96.9°F | Resp 18 | Ht 63.0 in | Wt 231.5 lb

## 2010-11-04 DIAGNOSIS — T8149XA Infection following a procedure, other surgical site, initial encounter: Secondary | ICD-10-CM

## 2010-11-04 DIAGNOSIS — T8140XA Infection following a procedure, unspecified, initial encounter: Secondary | ICD-10-CM

## 2010-11-04 MED ORDER — ONDANSETRON 4 MG PO TBDP: 4.0000 mg | ORAL_TABLET | Freq: Three times a day (TID) | ORAL | Status: DC | PRN

## 2010-11-04 MED ORDER — CEPHALEXIN 500 MG PO CAPS: 500.0000 mg | ORAL_CAPSULE | Freq: Four times a day (QID) | ORAL | Status: AC

## 2010-11-04 MED ORDER — PROMETHAZINE HCL 12.5 MG PO TABS: 12.5000 mg | ORAL_TABLET | Freq: Four times a day (QID) | ORAL | Status: DC | PRN

## 2010-11-04 NOTE — Patient Instructions (Signed)
Call for fever 101.5, worsening redness, persistent nausea & vomiting, worsening pain

## 2010-11-05 ENCOUNTER — Encounter (INDEPENDENT_AMBULATORY_CARE_PROVIDER_SITE_OTHER): Payer: Self-pay | Admitting: General Surgery

## 2010-11-05 LAB — URINE MICROSCOPIC-ADD ON

## 2010-11-05 LAB — COMPREHENSIVE METABOLIC PANEL
ALT: 374 — ABNORMAL HIGH
AST: 317 — ABNORMAL HIGH
Albumin: 3.5
Alkaline Phosphatase: 164 — ABNORMAL HIGH
Chloride: 106
GFR calc Af Amer: >60
Potassium: 3.5
Sodium: 138
Total Bilirubin: 1.6 — ABNORMAL HIGH
Total Protein: 6.8

## 2010-11-05 LAB — URINALYSIS, ROUTINE W REFLEX MICROSCOPIC
Glucose, UA: NEGATIVE
Nitrite: NEGATIVE
Protein, ur: NEGATIVE
Urobilinogen, UA: 0.2

## 2010-11-05 LAB — CK TOTAL AND CKMB (NOT AT ARMC)
CK, MB: 0.9
Total CK: 79

## 2010-11-05 NOTE — Progress Notes (Signed)
Chief complaint: Redness and pain around my umbilical incision, I am also nauseous  Procedure: Status post laparoscopic cholecystectomy and primary umbilical hernia repair on November 01, 2010 by by Dr. Biagio Quint  History of Present Ilness: 55 year old obese Caucasian female comes in today for followup. She has the above mentioned complaints. She states that her incision is red and tender. She denies any fevers or chills. She denies any vomiting. She states she is nauseous. She is able to tolerate liquids. She denies any upper abdominal or right upper quadrant pain. She denies any diarrhea or constipation. She denies any drainage from her incision. She denies any jaundice.  Physical Exam: BP 128/88  Pulse 80  Temp 96.9 F (36.1 C)  Resp 18  Ht 5\' 3"  (1.6 m)  Wt 231 lb 8 oz (105.008 kg)  BMI 41.01 kg/m2  Well-developed well-nourished obese Caucasian female in no apparent distress Pulmonary-lungs are clear Cardiac-regular rate and rhythm Abdomen-soft, obese, nondistended. Well approximated trocar incisions. Dermabond is intact. She has a vertical supraumbilical small incision. She has an old lower midline incision. She has a fair amount of cellulitis around her supraumbilical incision extending all the way down to her old lower midline incision. The area of blanching cellulitis is approximately 15 cm long by 8 cm in its widest portion. There is no guarding, or rebound. She is nontender in her upper abdomen Skin-no jaundice Eyes-no icterus  Pathology: []   Assessment and Plan: Status post laparoscopic cholecystectomy with primary umbilical hernia repair.  I cannot find Dr. Delice Lesch dictated operative note. I was able to locate the immediate postoperative note. Her vital signs are stable. Other than her nausea she has no systemic symptoms. Therefore I did not see the need for more extensive workup at this time. We'll place her on Keflex for the cellulitis. I gave her a prescription for  Phenergan for her nausea. She has a reaction to Zofran. We will have her see Dr. Biagio Quint early next week for recheck. I've asked her to call the office should she develop a fever greater than 101.5, worsening erythema, vomiting, abdominal pain, or any other questions or concerns.

## 2010-11-08 ENCOUNTER — Ambulatory Visit (INDEPENDENT_AMBULATORY_CARE_PROVIDER_SITE_OTHER): Payer: Medicare Other | Admitting: Surgery

## 2010-11-08 ENCOUNTER — Encounter (INDEPENDENT_AMBULATORY_CARE_PROVIDER_SITE_OTHER): Payer: Self-pay | Admitting: Surgery

## 2010-11-08 VITALS — BP 132/88 | HR 68 | Temp 96.9°F | Resp 16 | Ht 63.0 in | Wt 230.6 lb

## 2010-11-08 DIAGNOSIS — T8149XA Infection following a procedure, other surgical site, initial encounter: Secondary | ICD-10-CM | POA: Insufficient documentation

## 2010-11-08 DIAGNOSIS — T8140XA Infection following a procedure, unspecified, initial encounter: Secondary | ICD-10-CM

## 2010-11-08 LAB — BASIC METABOLIC PANEL
BUN: 14
BUN: 7
CO2: 22
CO2: 25
CO2: 26
CO2: 26
Calcium: 8.7
Calcium: 8.7
Chloride: 105
Chloride: 107
Chloride: 109
Creatinine, Ser: 0.84
Creatinine, Ser: 0.86
Creatinine, Ser: 0.92
GFR calc Af Amer: >60
GFR calc Af Amer: >60
GFR calc non Af Amer: >60
Glucose, Bld: 122 — ABNORMAL HIGH
Glucose, Bld: 154 — ABNORMAL HIGH
Potassium: 3.3 — ABNORMAL LOW
Sodium: 142

## 2010-11-08 LAB — CBC
Hemoglobin: 13.5
MCHC: 33.5
Platelets: 198
RDW: 12.8

## 2010-11-08 LAB — TYPE AND SCREEN

## 2010-11-08 NOTE — Progress Notes (Signed)
Subjective:     Patient ID: Marisa Ryan, female   DOB: [birth year], 55 y.o.   MRN: [id number]  HPI She is here for a followup visit. She developed cellulitis of her umbilical incision after laparoscopic cholecystectomy. She saw Dr. Andrey Campanile and was placed on Keflex last week. She has no complaints today. She reports that the erythema has resolved.  Review of Systems     Objective:   Physical Exam On exam today, there is no erythema along the umbilical incision. There is no erythema along her lower abdomen as described previously by Dr. Andrey Campanile. Her abdomen is minimally tender. Her gallbladder symptoms have completely resolved    Assessment:     Patient status post laparoscopic cholecystectomy with postoperative infection    Plan:     She will keep her on what Dr. Biagio Quint next week. She will continue her antibiotics. She will refrain from any heavy lifting.

## 2010-11-09 LAB — DIFFERENTIAL
Basophils Absolute: 0.1
Basophils Relative: 1
Eosinophils Absolute: 0.2
Lymphocytes Relative: 19
Monocytes Absolute: 0.4
Monocytes Relative: 9
Neutro Abs: 3.6
Neutrophils Relative %: 63

## 2010-11-09 LAB — CBC
Hemoglobin: 13.4
MCHC: 34.5
MCV: 87.5
Platelets: 229
Platelets: 266
RBC: 4.18
RDW: 12.5
RDW: 12.7

## 2010-11-09 LAB — BASIC METABOLIC PANEL
BUN: 24 — ABNORMAL HIGH
CO2: 27
Calcium: 9.7
Chloride: 100
Creatinine, Ser: 0.99
GFR calc non Af Amer: >60
Glucose, Bld: 134 — ABNORMAL HIGH
Glucose, Bld: 99
Sodium: 139

## 2010-11-18 ENCOUNTER — Encounter (INDEPENDENT_AMBULATORY_CARE_PROVIDER_SITE_OTHER): Payer: Self-pay | Admitting: General Surgery

## 2010-11-18 ENCOUNTER — Ambulatory Visit (INDEPENDENT_AMBULATORY_CARE_PROVIDER_SITE_OTHER): Payer: Medicare Other | Admitting: General Surgery

## 2010-11-18 VITALS — BP 103/66 | HR 61 | Temp 97.6°F | Resp 11 | Ht 63.0 in | Wt 232.4 lb

## 2010-11-18 DIAGNOSIS — Z4889 Encounter for other specified surgical aftercare: Secondary | ICD-10-CM

## 2010-11-18 DIAGNOSIS — Z5189 Encounter for other specified aftercare: Secondary | ICD-10-CM

## 2010-11-18 NOTE — Progress Notes (Signed)
Subjective:     Patient ID: Marisa Ryan, female   DOB: October 20, 1955, 55 y.o.   MRN: [id number]  HPI The patient follows up status post laparoscopic cholecystectomy with cholangiogram and umbilical hernia repair. She states that she been doing well since she had completed her antibiotic treatment for a postop skin infection. Since starting antibiotics the redness improved has not had any problems with that. She's tolerating regular diet although she does have some loose stools after eating fatty foods. She is also interested in weight loss surgery.Her pathology was benign.  Review of Systems     Objective:   Physical Exam No acute distress and nontoxic-appearing her abdomen is soft and nontender on exam her incisions are healing well without sign of infection. There is no cellulitis or drainage.    Assessment:     Status post laparoscopic cholecystectomy and umbilical hernia repair She seems to be recovering well from her surgery although she does have some postoperative diarrhea which is likely due to cholecystectomy. This should improve with time but may be persistent and I recommended low-fat diet. Morbid obesity    Plan:     She can follow up p.r.n. for her gallbladder but she is interested in weight loss surgery and so she will talk to her bariatric coordinator to get back in the pipeline for possible weight loss surgery.

## 2010-11-19 NOTE — Op Note (Signed)
Marisa, Ryan NO.:  [id number]  MEDICAL RECORD NO.:  [id number]  LOCATION:  1535                         FACILITY:  [facility]  PHYSICIAN:  Lodema Pilot, MD       DATE OF BIRTH:  02/27/1955  DATE OF PROCEDURE:  11/01/2010 DATE OF DISCHARGE:                              OPERATIVE REPORT   PROCEDURE:  Laparoscopic cholecystectomy with umbilical hernia repair.  PREOPERATIVE DIAGNOSES:  Abdominal pain and cholelithiasis.  POSTOPERATIVE DIAGNOSES:  Abdominal pain and cholelithiasis.  SURGEON:  Lodema Pilot, MD  ASSISTANT:  None.  ANESTHESIA:  General endotracheal anesthesia with 40 cc of 1% lidocaine with epinephrine and 0.25 cm Marcaine injected in 50/50 mixture.  FLUIDS:  1500 cc of crystalloid.  ESTIMATED BLOOD LOSS:  Minimal.  DRAINS:  None.  SPECIMEN:  Gallbladder and contents sent to Pathology for permanent sectioning.  COMPLICATIONS:  None apparent.  FINDINGS:  Normal-appearing gallbladder anatomy.  No cholangiogram was performed due to contrast allergy and she had some lower midline adhesions and a small umbilical hernia with no evidence of bowel contents, which was closed laparoscopically with Endoclose device and 0 Vicryl sutures.  OPERATIVE DETAILS:  Ms. Loose was seen and evaluated in the preoperative area and risks and benefits of the procedure were discussed in lay terms.  Informed consent was obtained.  Prophylactic antibiotics were given and she was taken to the operating room, placed on table in a supine position.  General endotracheal anesthesia was obtained and her abdomen was prepped and draped in a standard surgical fashion.  Given her umbilical hernia and lower midline incision, we decided to enter the abdomen above her umbilicus.  A small midline incision was made and dissection carried down to the subcutaneous tissue using blunt dissection.  The abdominal wall fascia was elevated and sharply incised and peritoneum was  entered under direct visualization and the 12 mm balloon port was placed into the abdomen under direct visualization. The abdomen was insufflated with carbon dioxide gas and laparoscope was introduced and there was no evidence of bowel injury upon entry.  She did have some lower midline adhesion, but these were not in the way to perform the cholecystectomy.  An 11 mm epigastric trocar was placed and two 5 mm right upper quadrant trocars were placed under direct visualization and the gallbladder was retracted cephalad.  The colon was adhered to the gallbladder with some filmy adhesions.  These were divided with sharp dissection.  Bovie electrocautery was used during this part of the dissection.  With the adhesions taken down sharply, this allowed me to elevate the gallbladder cephalad and the remainder of the peritoneal attachments were taken down using blunt dissection.  The Hartmann pouch was retracted laterally and the  triangle of Fallot was exposed.  Cystic duct was exposed using blunt dissection.  The cystic duct was easily identified as well as the cystic artery, which was clipped in its usual anatomic position on the medial aspect of the gallbladder.  The artery was dissected circumferentially and divided between hemoclips and the critical view of safety was obtained with a single cystic duct entering the gallbladder and the liver parenchyma was visualized  through the triangle of Fallot.  A clip was placed on the gallbladder side of the cystic duct and a cholangiogram catheter placed through the abdominal wall through a 14 gauge angiocatheter.  A small cystic ductotomy was made and the catheter was placed into duct.  I had planned on doing a cholangiogram for her, however, given her contrast allergy to IVP dye, we decided not to give her iodinated contrast and the cholangiogram was aborted.  Catheter was removed and the duct was clipped between hemoclips and the cystic duct was  divided.  The cystic artery had already been divided and then the gallbladder was removed from the gallbladder fossa using Bovie electrocautery.  After the gallbladder was removed from the gallbladder fossa, it was placed an EndoCatch bag and removed from the umbilical trocar site and the EndoCatch bag was passed off the table and sent to pathology for permanent suctioning.  The balloon port was then replaced in the abdomen.  The right upper quadrant was inspected for hemostasis, which was noted to be adequate in the gallbladder fossa and the surrounding area.  2 L of irrigation was used to irrigate the right upper quadrant until the irrigation returned clear.  There was spillage of bile from the gallbladder and this again was irrigated out until the irrigation was clear.  Clips appeared to be in good position and there was no evidence of bleeding or bowel injury.  The balloon port  was then removed.  We decided to close the known umbilical hernia defect using Endoclose device to prevent bowel herniation.  Under direct visualization, 0-Vicryl figure-of-eight suture was placed through the fascia to close the umbilical hernia defect and sutures were secured. The interrupted 0 Vicryl sutures were placed through the supraumbilical incision to close the fascia at the balloon port.  A 5 mm laparoscope was placed through the lateral trocar site and the abdominal wall closure was noted be adequate.  There was no evidence of bowel injury. The right upper quadrant appeared to be hemostatic.  The remainder of the trocars were removed under direct visualization and abdominal wall was noted be hemostatic.  The skin was then anesthetized with a total of 40 cc of 1% lidocaine with epinephrine and 0.25% Marcaine in a 50/50 mixture and the skin edges were approximated with 4-0 Monocryl subcuticular suture.  Skin was washed and dried and Dermabond was applied.  All sponge, needle, and instrument counts  were correct at the end of the case and the patient tolerated the procedure well without apparent complications.          ______________________________ Lodema Pilot, MD     BL/MEDQ  D:  11/01/2010  T:  11/02/2010  Job:  [id number]  Electronically Signed by Lodema Pilot DO on 11/19/2010 12:10:43 AM

## 2010-12-09 NOTE — Discharge Summary (Signed)
  NAMEMARGARETHE, Marisa Ryan                 ACCOUNT NO.:  [id number]  MEDICAL RECORD NO.:  [id number]  LOCATION:  1535                         FACILITY:  [facility]  PHYSICIAN:  Lodema Pilot, MD       DATE OF BIRTH:  06-03-1955  DATE OF ADMISSION:  11/01/2010 DATE OF DISCHARGE:  11/02/2010                              DISCHARGE SUMMARY   ADMITTING DIAGNOSES:  Nausea and abdominal pain, status post laparoscopic cholecystectomy.  DISCHARGE DIAGNOSES:  Nausea and abdominal pain, status post laparoscopic cholecystectomy.  PRINCIPAL PROCEDURES PERFORMED:  Laparoscopic cholecystectomy with intraoperative cholangiogram performed on November 01, 2010.  HISTORY OF PRESENT ILLNESS:  Marisa Ryan is a 55 year old female with symptomatic cholelithiasis who underwent laparoscopic cholecystectomy with intraoperative cholangiogram performed on November 01, 2010.  For a full history and physical, please refer to the history and physical in epic.  She was admitted for postoperative discomfort and nausea.  HOSPITAL COURSE:  Marisa Ryan underwent elective planned outpatient laparoscopic cholecystectomy with intraoperative cholangiogram on November 01, 2010.  Please refer to the dictated operative note for details of the procedure.  The procedure was uncomplicated and she was transferred to the recovery room in stable condition.  We planned for an outpatient procedure; however, she did not feel comfortable going home that evening and due to her discomfort and nausea, she was kept overnight for observation.  Overnight she did well without any complications.  She remained hemodynamically stable and her abdominal pain and nausea improved.  She was stable and ready for discharge.  On postoperative day #1, tolerating regular diet and on oral pain medication.  DISCHARGE MEDICATIONS:  She was discharged home on Vicodin 2 tablets by mouth every 6 hours p.r.n. and instructed to resume her medications.  FOLLOWUP:   She was instructed to follow up with me in my clinic in 2-3 weeks for repeat evaluation and return to the emergency room for any redness around the wound or discharge, increase in abdominal pain or continued nausea, and inability to eat.          ______________________________ Lodema Pilot, MD     BL/MEDQ  D:  11/22/2010  T:  11/23/2010  Job:  [id number]  Electronically Signed by Lodema Pilot DO on 12/09/2010 10:53:47 AM

## 2011-08-30 ENCOUNTER — Emergency Department (HOSPITAL_COMMUNITY)
Admission: EM | Admit: 2011-08-30 | Discharge: 2011-08-30 | Disposition: A | Discharge status: Home / Self Care | Payer: Medicare Other | Attending: Emergency Medicine | Admitting: Emergency Medicine

## 2011-08-30 ENCOUNTER — Emergency Department (HOSPITAL_COMMUNITY): Payer: Medicare Other

## 2011-08-30 ENCOUNTER — Encounter (HOSPITAL_COMMUNITY): Payer: Self-pay | Admitting: *Deleted

## 2011-08-30 DIAGNOSIS — I10 Essential (primary) hypertension: Secondary | ICD-10-CM | POA: Insufficient documentation

## 2011-08-30 DIAGNOSIS — Y92009 Unspecified place in unspecified non-institutional (private) residence as the place of occurrence of the external cause: Secondary | ICD-10-CM | POA: Insufficient documentation

## 2011-08-30 DIAGNOSIS — Z87891 Personal history of nicotine dependence: Secondary | ICD-10-CM | POA: Insufficient documentation

## 2011-08-30 DIAGNOSIS — Z9089 Acquired absence of other organs: Secondary | ICD-10-CM | POA: Insufficient documentation

## 2011-08-30 DIAGNOSIS — Y93H2 Activity, gardening and landscaping: Secondary | ICD-10-CM | POA: Insufficient documentation

## 2011-08-30 DIAGNOSIS — X500XXA Overexertion from strenuous movement or load, initial encounter: Secondary | ICD-10-CM | POA: Insufficient documentation

## 2011-08-30 DIAGNOSIS — S8263XA Displaced fracture of lateral malleolus of unspecified fibula, initial encounter for closed fracture: Secondary | ICD-10-CM | POA: Insufficient documentation

## 2011-08-30 DIAGNOSIS — S82839A Other fracture of upper and lower end of unspecified fibula, initial encounter for closed fracture: Secondary | ICD-10-CM

## 2011-08-30 MED ORDER — PERCOCET 5-325 MG PO TABS: 1.0000 | ORAL_TABLET | ORAL | Status: AC | PRN

## 2011-08-30 MED ORDER — OXYCODONE-ACETAMINOPHEN 5-325 MG PO TABS
1.0000 | ORAL_TABLET | Freq: Once | ORAL | Status: AC
  Administered 2011-08-30: 1 via ORAL
  Filled 2011-08-30: qty 1

## 2011-08-30 MED ORDER — MORPHINE SULFATE 4 MG/ML IJ SOLN
4.0000 mg | Freq: Once | INTRAMUSCULAR | Status: AC
  Administered 2011-08-30: 4 mg via INTRAVENOUS
  Filled 2011-08-30: qty 1

## 2011-08-30 NOTE — ED Provider Notes (Signed)
History     CSN: [id number]  Arrival date & time 08/30/11  1349   First MD Initiated Contact with Patient 08/30/11 1408      Chief Complaint  Patient presents with  . Ankle Injury    (Consider location/radiation/quality/duration/timing/severity/associated sxs/prior treatment) HPI  Patient presents via EMS. She relates she was mowing her yard and she stepped in a hole twisting her right ankle. She relates she fell a pop. EMS reported deformity to her ankle. She received 250 mcg of fentanyl enroute. She states she's starting to have pain again. She denies any other injury such as hitting her head. She denies any numbness in her foot. Patient states she last ate about 10:30 this morning.  PCP Dr. Benedetto Goad at [facility] Dr. Shelle Iron  Past Medical History  Diagnosis Date  . Hypertension     Patient only takes medication when needed.  Marland Kitchen UTI (urinary tract infection)     history of them  . Abdominal pain   . Nausea   . No appetite   . Redness     around incision site and radiates to the right side of abdomen   . Hernia     Past Surgical History  Procedure Date  . Abdominal hysterectomy 01/2003  . Bladder suspension 11/2002  . Removal of kidney 2008    left  . Laparoscopic cholecystectomy 11/01/10    Dr Biagio Quint  . Umbilical hernia repair 11/01/10    Dr Biagio Quint, no mesh  . Hernia repair     Family History  Problem Relation Age of Onset  . Cancer Mother     breast    History  Substance Use Topics  . Smoking status: Former Smoker    Quit date: 05/24/1991  . Smokeless tobacco: Not on file  . Alcohol Use: No  lives with spouse  OB History    Grav Para Term Preterm Abortions TAB SAB Ect Mult Living                  Review of Systems  All other systems reviewed and are negative.    Allergies  Cortisone; Ivp dye; and Zofran  Home Medications   Current Outpatient Rx  Name Route Sig Dispense Refill  . AMOXICILLIN 500 MG PO CAPS  Oral Take 500 mg by mouth 2 (two) times daily.    Marland Kitchen CLARITHROMYCIN 500 MG PO TABS Oral Take 500 mg by mouth 2 (two) times daily.    Marland Kitchen OMEPRAZOLE 20 MG PO CPDR Oral Take 20 mg by mouth 2 (two) times daily.    . TRAMADOL HCL 50 MG PO TABS Oral Take 50 mg by mouth 3 (three) times daily as needed. For pain      BP 116/65  Pulse 66  Temp 98.1 F (36.7 C) (Oral)  Resp 16  SpO2 98%  Vital signs normal    Physical Exam  Nursing note and vitals reviewed. Constitutional: She is oriented to person, place, and time. She appears well-developed and well-nourished.  Non-toxic appearance. She does not appear ill. No distress.  HENT:  Head: Normocephalic and atraumatic.  Right Ear: External ear normal.  Left Ear: External ear normal.  Nose: Nose normal. No mucosal edema or rhinorrhea.  Mouth/Throat: Oropharynx is clear and moist and mucous membranes are normal. No dental abscesses or uvula swelling.  Eyes: Conjunctivae and EOM are normal. Pupils are equal, round, and reactive to light.  Neck: Normal range of motion and full passive range of  motion without pain. Neck supple.  Pulmonary/Chest: Effort normal and breath sounds normal. No respiratory distress. She has no rhonchi. She exhibits no crepitus.  Abdominal: Normal appearance.  Musculoskeletal: Normal range of motion. She exhibits edema and tenderness.       Moves all extremities well.  Nontender in right knee and RLE, has swelling over the lateral malleolus with tenderness. Nontender foot, intact pulses.   Neurological: She is alert and oriented to person, place, and time. She has normal strength. No cranial nerve deficit.  Skin: Skin is warm, dry and intact. No rash noted. No erythema. No pallor.  Psychiatric: She has a normal mood and affect. Her speech is normal and behavior is normal. Her mood appears not anxious.    ED Course  Procedures (including critical care time)   Medications  morphine 4 MG/ML injection 4 mg (4 mg Intravenous  Given 08/30/11 1457)  oxyCODONE-acetaminophen (PERCOCET/ROXICET) 5-325 MG per tablet 1 tablet (1 tablet Oral Given 08/30/11 1627)    Pt placed in post/stirrup splint and crutches by ortho tech.    Dg Ankle Complete Right  08/30/2011  *RADIOLOGY REPORT*  Clinical Data: Twisting injury, pain.  RIGHT ANKLE - COMPLETE 3+ VIEW  Comparison: Plain films right foot 12/30/2005.  Findings: There is a nondisplaced fracture off the distal most lateral malleolus.  No other acute bony or joint abnormality is identified.  Plantar calcaneal spur is seen.  Marked lateral soft tissue swelling is noted.  IMPRESSION: Nondisplaced fracture of the distal most lateral malleolus with associated soft tissue swelling.  Original Report Authenticated By: Bernadene Bell. D'ALESSIO, M.D.     1. Fracture of distal fibula     New Prescriptions   PERCOCET 5-325 MG PER TABLET    Take 1 tablet by mouth every 4 (four) hours as needed for pain.    Plan discharge  Devoria Albe, MD, FACEP   MDM          Ward Givens, MD 08/30/11 [phone]

## 2011-08-30 NOTE — ED Notes (Signed)
XBJ:YNW2<NF> Expected date:08/30/11<BR> Expected time: 1:38 PM<BR> Means of arrival:Ambulance<BR> Comments:<BR> Ankle injury

## 2011-08-30 NOTE — ED Notes (Signed)
Pt in by ems, was walking in yard and stepped in a hole, felt a pop and c/o pain to R ankle, swelling and deformity noted to same. Pt has received fentanyl en route. 20g R AC. 500cc NS en route.

## 2011-09-08 IMAGING — CR DG CHEST 2V
2 series · 2 of 2 positions shown · non-contrast
Comparison: Chest radiograph 07/08/2008

CLINICAL DATA: Feels like something stuck in throat

CHEST - 2 VIEW

[w chest pa]
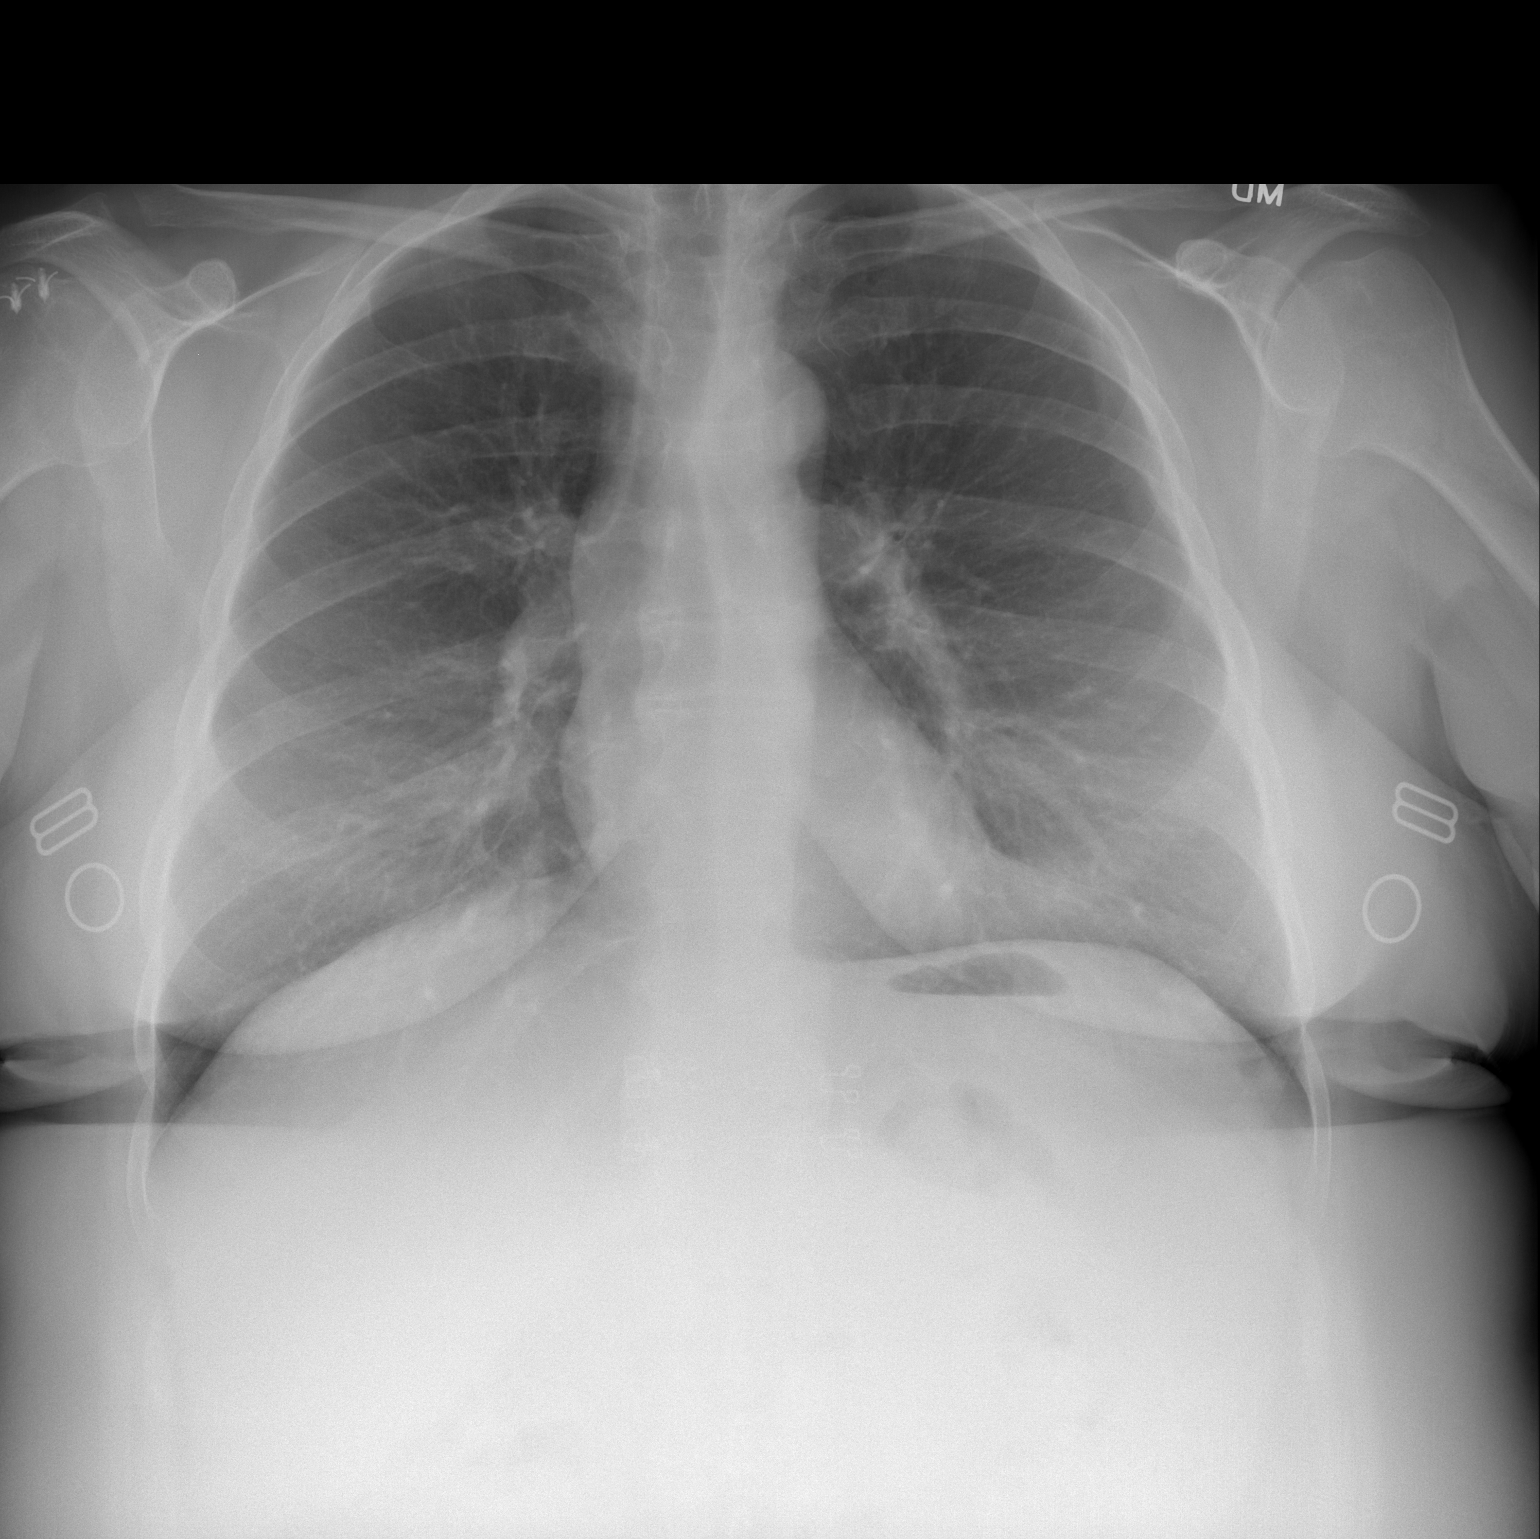

[w chest lat]
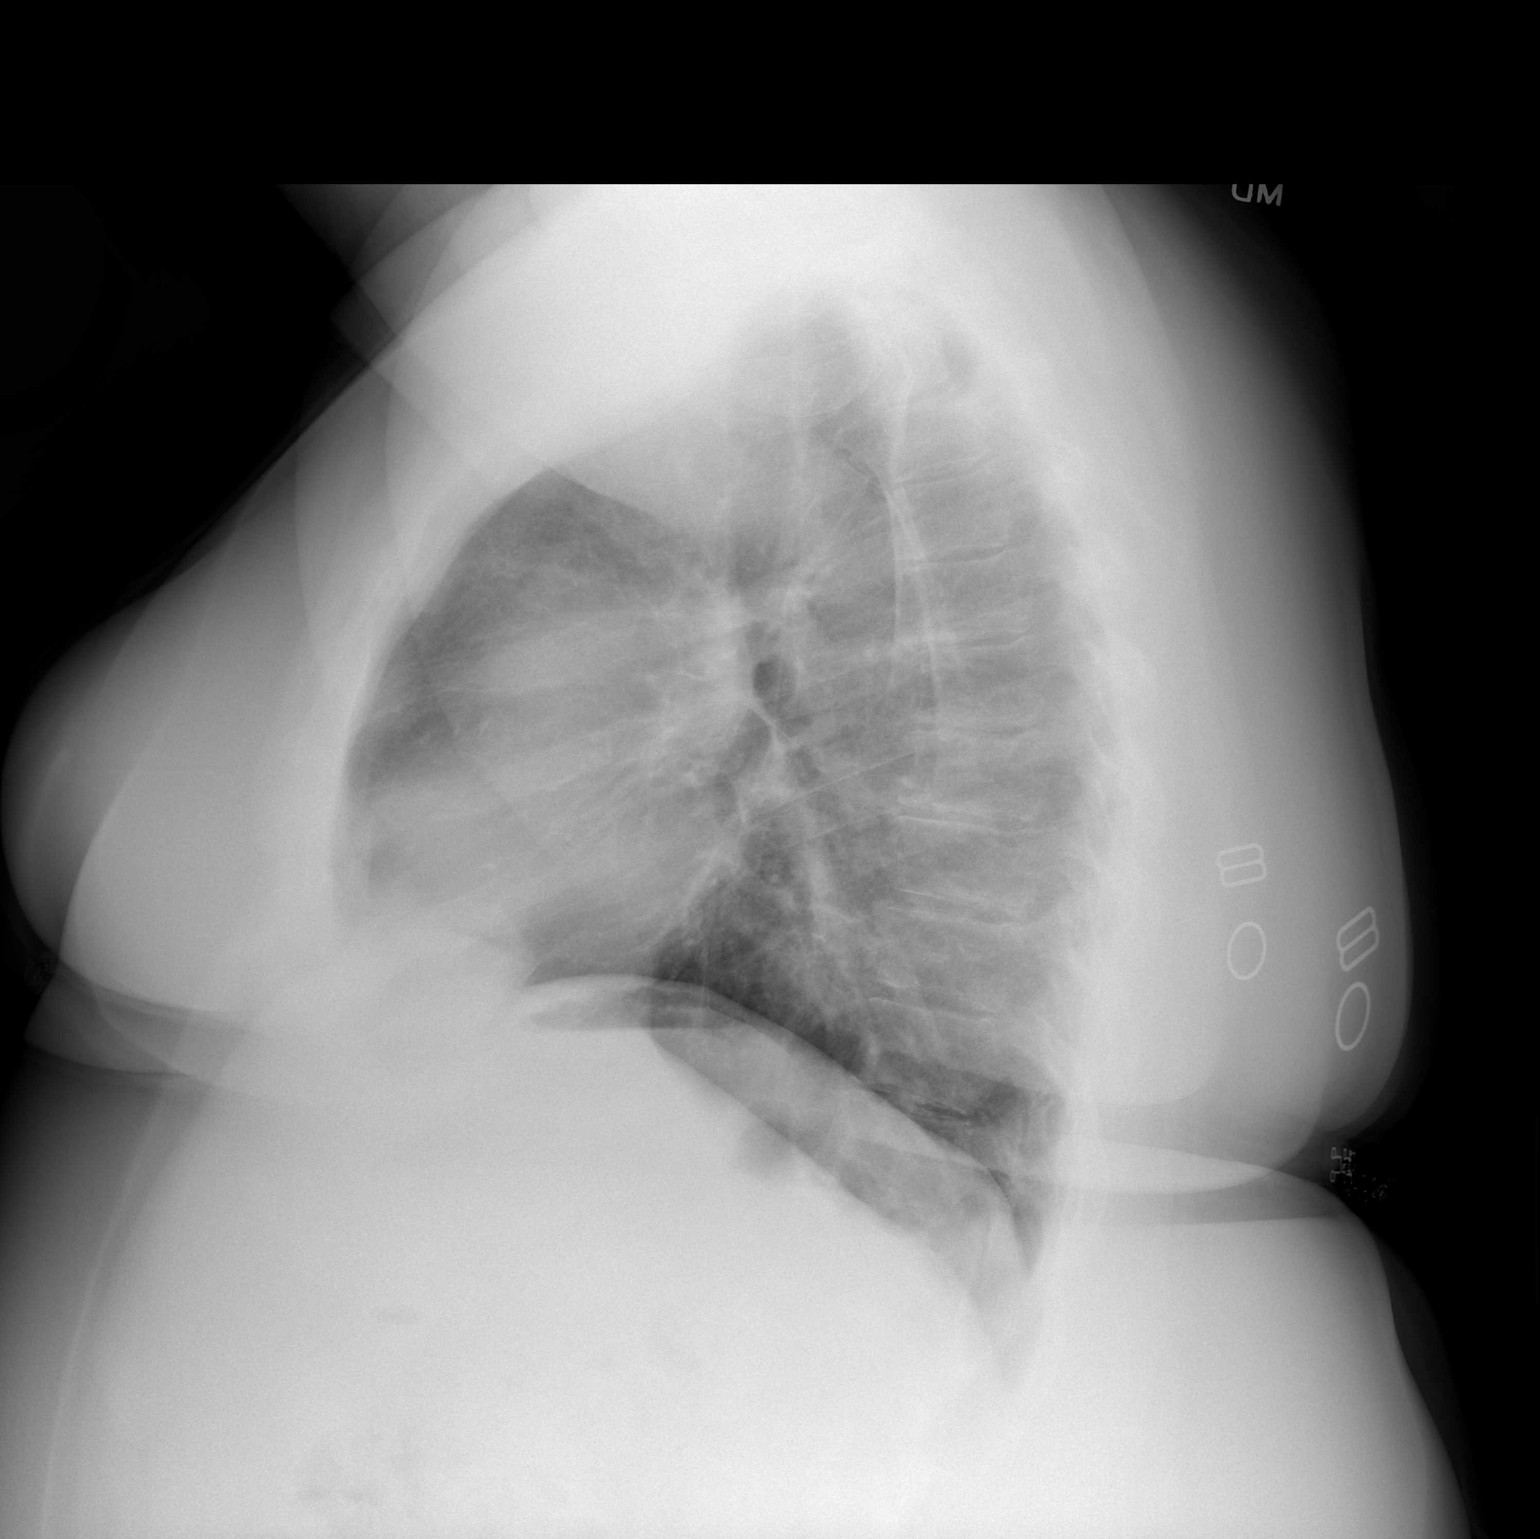

[2 of 2 positions shown; findings below may reference images not displayed]

FINDINGS: Normal mediastinum and cardiac silhouette.  The trachea
appears normal.  No evidence of effusion, infiltrate, or
pneumothorax. The esophagus appears normal.
IMPRESSION: 1.  No acute cardiopulmonary process.

                  2.  The trachea appears normal.

## 2012-11-13 ENCOUNTER — Other Ambulatory Visit (HOSPITAL_COMMUNITY): Payer: Self-pay | Admitting: Family Medicine

## 2012-11-13 DIAGNOSIS — Z1231 Encounter for screening mammogram for malignant neoplasm of breast: Secondary | ICD-10-CM

## 2012-11-20 ENCOUNTER — Ambulatory Visit (HOSPITAL_COMMUNITY)
Admission: RE | Admit: 2012-11-20 | Discharge: 2012-11-20 | Disposition: A | Discharge status: Home / Self Care | Payer: Medicare Other | Source: Ambulatory Visit | Attending: Family Medicine | Admitting: Family Medicine

## 2012-11-20 DIAGNOSIS — Z1231 Encounter for screening mammogram for malignant neoplasm of breast: Secondary | ICD-10-CM | POA: Insufficient documentation

## 2012-11-26 ENCOUNTER — Other Ambulatory Visit: Payer: Self-pay | Admitting: Family Medicine

## 2012-11-26 DIAGNOSIS — R928 Other abnormal and inconclusive findings on diagnostic imaging of breast: Secondary | ICD-10-CM

## 2012-12-10 ENCOUNTER — Ambulatory Visit
Admission: RE | Admit: 2012-12-10 | Discharge: 2012-12-10 | Disposition: A | Discharge status: Home / Self Care | Payer: Medicare Other | Source: Ambulatory Visit | Attending: Family Medicine | Admitting: Family Medicine

## 2012-12-10 DIAGNOSIS — R928 Other abnormal and inconclusive findings on diagnostic imaging of breast: Secondary | ICD-10-CM

## 2013-03-18 ENCOUNTER — Emergency Department (HOSPITAL_COMMUNITY)
Admission: EM | Admit: 2013-03-18 | Discharge: 2013-03-18 | Disposition: A | Discharge status: Home / Self Care | Payer: Medicare Other | Attending: Emergency Medicine | Admitting: Emergency Medicine

## 2013-03-18 ENCOUNTER — Encounter (HOSPITAL_COMMUNITY): Payer: Self-pay | Admitting: Emergency Medicine

## 2013-03-18 DIAGNOSIS — R112 Nausea with vomiting, unspecified: Secondary | ICD-10-CM | POA: Insufficient documentation

## 2013-03-18 DIAGNOSIS — Z905 Acquired absence of kidney: Secondary | ICD-10-CM | POA: Insufficient documentation

## 2013-03-18 DIAGNOSIS — Z87440 Personal history of urinary (tract) infections: Secondary | ICD-10-CM | POA: Insufficient documentation

## 2013-03-18 DIAGNOSIS — M79609 Pain in unspecified limb: Secondary | ICD-10-CM | POA: Insufficient documentation

## 2013-03-18 DIAGNOSIS — Z79899 Other long term (current) drug therapy: Secondary | ICD-10-CM | POA: Insufficient documentation

## 2013-03-18 DIAGNOSIS — R197 Diarrhea, unspecified: Secondary | ICD-10-CM | POA: Insufficient documentation

## 2013-03-18 DIAGNOSIS — Z7982 Long term (current) use of aspirin: Secondary | ICD-10-CM | POA: Insufficient documentation

## 2013-03-18 DIAGNOSIS — I10 Essential (primary) hypertension: Secondary | ICD-10-CM | POA: Insufficient documentation

## 2013-03-18 DIAGNOSIS — Z8719 Personal history of other diseases of the digestive system: Secondary | ICD-10-CM | POA: Insufficient documentation

## 2013-03-18 DIAGNOSIS — R1084 Generalized abdominal pain: Secondary | ICD-10-CM | POA: Insufficient documentation

## 2013-03-18 DIAGNOSIS — Z87891 Personal history of nicotine dependence: Secondary | ICD-10-CM | POA: Insufficient documentation

## 2013-03-18 DIAGNOSIS — Z90710 Acquired absence of both cervix and uterus: Secondary | ICD-10-CM | POA: Insufficient documentation

## 2013-03-18 DIAGNOSIS — E860 Dehydration: Secondary | ICD-10-CM | POA: Insufficient documentation

## 2013-03-18 DIAGNOSIS — Z9089 Acquired absence of other organs: Secondary | ICD-10-CM | POA: Insufficient documentation

## 2013-03-18 DIAGNOSIS — Z9889 Other specified postprocedural states: Secondary | ICD-10-CM | POA: Insufficient documentation

## 2013-03-18 DIAGNOSIS — R350 Frequency of micturition: Secondary | ICD-10-CM | POA: Insufficient documentation

## 2013-03-18 LAB — LIPASE, BLOOD: Lipase: 16 U/L (ref 11–59)

## 2013-03-18 LAB — URINALYSIS, ROUTINE W REFLEX MICROSCOPIC
Bilirubin Urine: NEGATIVE
Glucose, UA: NEGATIVE mg/dL
HGB URINE DIPSTICK: NEGATIVE
Ketones, ur: NEGATIVE mg/dL
Leukocytes, UA: NEGATIVE
Nitrite: NEGATIVE
PROTEIN: NEGATIVE mg/dL
Specific Gravity, Urine: 1.023 (ref 1.005–1.030)
UROBILINOGEN UA: 0.2 mg/dL (ref 0.0–1.0)
pH: 5.5 (ref 5.0–8.0)

## 2013-03-18 LAB — COMPREHENSIVE METABOLIC PANEL
ALBUMIN: 4.1 g/dL (ref 3.5–5.2)
ALT: 17 U/L (ref 0–35)
AST: 15 U/L (ref 0–37)
Alkaline Phosphatase: 102 U/L (ref 39–117)
BUN: 23 mg/dL (ref 6–23)
CALCIUM: 9.3 mg/dL (ref 8.4–10.5)
CO2: 20 meq/L (ref 19–32)
Chloride: 105 mEq/L (ref 96–112)
Creatinine, Ser: 0.86 mg/dL (ref 0.50–1.10)
GFR calc Af Amer: 85 mL/min — ABNORMAL LOW (ref >90)
GFR, EST NON AFRICAN AMERICAN: 73 mL/min — AB (ref >90)
Glucose, Bld: 132 mg/dL — ABNORMAL HIGH (ref 70–99)
Potassium: 4.1 mEq/L (ref 3.7–5.3)
Sodium: 142 mEq/L (ref 137–147)
TOTAL PROTEIN: 7.8 g/dL (ref 6.0–8.3)
Total Bilirubin: 0.4 mg/dL (ref 0.3–1.2)

## 2013-03-18 LAB — CBC WITH DIFFERENTIAL/PLATELET
BASOS ABS: 0 10*3/uL (ref 0.0–0.1)
BASOS PCT: 0 % (ref 0–1)
EOS PCT: 0 % (ref 0–5)
Eosinophils Absolute: 0 10*3/uL (ref 0.0–0.7)
HEMATOCRIT: 46.4 % — AB (ref 36.0–46.0)
Hemoglobin: 16.1 g/dL — ABNORMAL HIGH (ref 12.0–15.0)
Lymphocytes Relative: 4 % — ABNORMAL LOW (ref 12–46)
Lymphs Abs: 0.4 10*3/uL — ABNORMAL LOW (ref 0.7–4.0)
MCH: 29.8 pg (ref 26.0–34.0)
MCHC: 34.7 g/dL (ref 30.0–36.0)
MCV: 85.9 fL (ref 78.0–100.0)
Monocytes Absolute: 0.3 10*3/uL (ref 0.1–1.0)
Monocytes Relative: 3 % (ref 3–12)
Neutro Abs: 8.9 10*3/uL — ABNORMAL HIGH (ref 1.7–7.7)
Neutrophils Relative %: 93 % — ABNORMAL HIGH (ref 43–77)
PLATELETS: 176 10*3/uL (ref 150–400)
RBC: 5.4 MIL/uL — ABNORMAL HIGH (ref 3.87–5.11)
RDW: 12.9 % (ref 11.5–15.5)
WBC: 9.6 10*3/uL (ref 4.0–10.5)

## 2013-03-18 MED ORDER — SODIUM CHLORIDE 0.9 % IV BOLUS (SEPSIS)
1000.0000 mL | Freq: Once | INTRAVENOUS | Status: AC
  Administered 2013-03-18: 1000 mL via INTRAVENOUS

## 2013-03-18 MED ORDER — METOCLOPRAMIDE HCL 10 MG PO TABS: 10.0000 mg | ORAL_TABLET | Freq: Three times a day (TID) | ORAL | Status: DC | PRN

## 2013-03-18 NOTE — Discharge Instructions (Signed)
Read the information below.  Use the prescribed medication as directed.  Please discuss all new medications with your pharmacist.  You may return to the Emergency Department at any time for worsening condition or any new symptoms that concern you.  If you develop high fevers, worsening abdominal pain, uncontrolled vomiting, or are unable to tolerate fluids by mouth, return to the ER for a recheck.    Dehydration, Adult Dehydration means your body does not have as much fluid as it needs. Your kidneys, brain, and heart will not work properly without the right amount of fluids and salt.  HOME CARE  Ask your doctor how to replace body fluid losses (rehydrate).  Drink enough fluids to keep your pee (urine) clear or pale yellow.  Drink small amounts of fluids often if you feel sick to your stomach (nauseous) or throw up (vomit).  Eat like you normally do.  Avoid:  Foods or drinks high in sugar.  Bubbly (carbonated) drinks.  Juice.  Very hot or cold fluids.  Drinks with caffeine.  Fatty, greasy foods.  Alcohol.  Tobacco.  Eating too much.  Gelatin desserts.  Wash your hands to avoid spreading germs (bacteria, viruses).  Only take medicine as told by your doctor.  Keep all doctor visits as told. GET HELP RIGHT AWAY IF:   You cannot drink something without throwing up.  You get worse even with treatment.  Your vomit has blood in it or looks greenish.  Your poop (stool) has blood in it or looks black and tarry.  You have not peed in 6 to 8 hours.  You pee a small amount of very dark pee.  You have a fever.  You pass out (faint).  You have belly (abdominal) pain that gets worse or stays in one spot (localizes).  You have a rash, stiff neck, or bad headache.  You get easily annoyed, sleepy, or are hard to wake up.  You feel weak, dizzy, or very thirsty. MAKE SURE YOU:   Understand these instructions.  Will watch your condition.  Will get help right away if  you are not doing well or get worse. Document Released: 11/20/2008 Document Revised: 04/18/2011 Document Reviewed: 09/13/2010 [facility] Patient Information 2014 [place], [place].

## 2013-03-18 NOTE — ED Provider Notes (Signed)
Medical screening examination/treatment/procedure(s) were performed by non-physician practitioner and as supervising physician I was immediately available for consultation/collaboration.  Carmin Muskrat, MD 03/18/13 [phone]

## 2013-03-18 NOTE — ED Provider Notes (Signed)
CSN: [id number]     Arrival date & time 03/18/13  1052 History   First MD Initiated Contact with Patient 03/18/13 1223     Chief Complaint  Patient presents with  . Diarrhea  . Nausea  . vomiting      (Consider location/radiation/quality/duration/timing/severity/associated sxs/prior Treatment) HPI Pt with N/V/D that began overnight, also with bilateral leg aching.  All symptoms woke her from sleep around 1:30am.  Denies abdominal pain at the beginning, but has developed slight soreness.  Has had 10-12 episodes of emesis, all brown and nonbloody and 8-10 episodes of diarrhea, also nonbloody.  Pt has chronic urinary frequency that is unchanged.  Denies fevers, chills, CP, SOB, dysuria, vaginal symptoms.  No known sick contacts, no recent travel, no strange or new foods.      Past Medical History  Diagnosis Date  . Hypertension     Patient only takes medication when needed.  Marland Kitchen UTI (urinary tract infection)     history of them  . Abdominal pain   . Nausea   . No appetite   . Redness     around incision site and radiates to the right side of abdomen   . Hernia    Past Surgical History  Procedure Laterality Date  . Abdominal hysterectomy  01/2003  . Bladder suspension  11/2002  . Removal of kidney  2008    left  . Laparoscopic cholecystectomy  11/01/10    Dr Lilyan Punt  . Umbilical hernia repair  11/01/10    Dr Lilyan Punt, no mesh  . Hernia repair     Family History  Problem Relation Age of Onset  . Cancer Mother     breast   History  Substance Use Topics  . Smoking status: Former Smoker    Quit date: 05/24/1991  . Smokeless tobacco: Not on file  . Alcohol Use: No   OB History   Grav Para Term Preterm Abortions TAB SAB Ect Mult Living                 Review of Systems  Constitutional: Negative for fever.  Respiratory: Negative for cough and shortness of breath.   Cardiovascular: Negative for chest pain.  Gastrointestinal: Positive for nausea, vomiting, abdominal pain and  diarrhea.  Genitourinary: Negative for dysuria, urgency, frequency, vaginal bleeding and vaginal discharge.  All other systems reviewed and are negative.      Allergies  Cortisone; Ivp dye; and Zofran  Home Medications   Current Outpatient Rx  Name  Route  Sig  Dispense  Refill  . aspirin EC 81 MG tablet   Oral   Take 81 mg by mouth daily.         . hydrochlorothiazide (HYDRODIURIL) 25 MG tablet   Oral   Take 25 mg by mouth daily.         Marland Kitchen omeprazole (PRILOSEC) 20 MG capsule   Oral   Take 20 mg by mouth 2 (two) times daily.         . phentermine (ADIPEX-P) 37.5 MG tablet   Oral   Take 37.5 mg by mouth daily before breakfast.         . metoCLOPramide (REGLAN) 10 MG tablet   Oral   Take 1 tablet (10 mg total) by mouth every 8 (eight) hours as needed for nausea or vomiting.   15 tablet   0    BP 132/68  Pulse 97  Temp(Src) 98.7 F (37.1 C) (Oral)  Resp 19  SpO2  97% Physical Exam  Nursing note and vitals reviewed. Constitutional: She appears well-developed and well-nourished. No distress.  HENT:  Head: Normocephalic and atraumatic.  Neck: Neck supple.  Cardiovascular: Normal rate and regular rhythm.   Pulmonary/Chest: Effort normal and breath sounds normal. No respiratory distress. She has no wheezes. She has no rales.  Abdominal: Soft. She exhibits no distension and no mass. There is generalized tenderness. There is no rebound and no guarding.  obese  Musculoskeletal:  Lower extremities without erythema, edema, warmth, or tenderness.  Distal pulses intact.    Neurological: She is alert.  Skin: She is not diaphoretic.    ED Course  Procedures (including critical care time) Labs Review Labs Reviewed  CBC WITH DIFFERENTIAL - Abnormal; Notable for the following:    RBC 5.40 (*)    Hemoglobin 16.1 (*)    HCT 46.4 (*)    Neutrophils Relative % 93 (*)    Neutro Abs 8.9 (*)    Lymphocytes Relative 4 (*)    Lymphs Abs 0.4 (*)    All other  components within normal limits  COMPREHENSIVE METABOLIC PANEL - Abnormal; Notable for the following:    Glucose, Bld 132 (*)    GFR calc non Af Amer 73 (*)    GFR calc Af Amer 85 (*)    All other components within normal limits  URINALYSIS, ROUTINE W REFLEX MICROSCOPIC  LIPASE, BLOOD   Imaging Review No results found.  EKG Interpretation   None       MDM   Final diagnoses:  Nausea vomiting and diarrhea  Mild dehydration    Pt with N/V/D and gradually developed abdominal soreness that began overnight.  Labs reflect mild dehydration.  Abdominal exam is benign.  Pt received 1 L IVF and drank 4-5 cups of water and soda while she was in the ED. Reported feeling much better.  /C home with reglan, PCP follow up.  Likely viral illness.  Discussed result, findings, treatment, and follow up  with patient.  Pt given return precautions.  Pt verbalizes understanding and agrees with plan.        [place], PA-C 03/18/13 1523

## 2013-03-18 NOTE — ED Notes (Signed)
Pt has tolerated 2 cokes and is on her 3rd

## 2013-03-18 NOTE — ED Notes (Signed)
Pt states that around 0145 she was woken up from her sleep with n/v/d.  Pt states it has been constant since. Pt denies abd pain but does c/o bilat leg pain.

## 2013-07-21 ENCOUNTER — Emergency Department (HOSPITAL_COMMUNITY): Payer: Medicare Other

## 2013-07-21 ENCOUNTER — Encounter (HOSPITAL_COMMUNITY): Payer: Self-pay | Admitting: Radiology

## 2013-07-21 ENCOUNTER — Emergency Department (HOSPITAL_COMMUNITY)
Admission: EM | Admit: 2013-07-21 | Discharge: 2013-07-21 | Disposition: A | Discharge status: Home / Self Care | Payer: Medicare Other | Attending: Emergency Medicine | Admitting: Emergency Medicine

## 2013-07-21 DIAGNOSIS — Z791 Long term (current) use of non-steroidal anti-inflammatories (NSAID): Secondary | ICD-10-CM | POA: Insufficient documentation

## 2013-07-21 DIAGNOSIS — R519 Headache, unspecified: Secondary | ICD-10-CM

## 2013-07-21 DIAGNOSIS — R51 Headache: Secondary | ICD-10-CM | POA: Insufficient documentation

## 2013-07-21 DIAGNOSIS — Z8719 Personal history of other diseases of the digestive system: Secondary | ICD-10-CM | POA: Diagnosis not present

## 2013-07-21 DIAGNOSIS — I10 Essential (primary) hypertension: Secondary | ICD-10-CM | POA: Insufficient documentation

## 2013-07-21 DIAGNOSIS — Z79899 Other long term (current) drug therapy: Secondary | ICD-10-CM | POA: Insufficient documentation

## 2013-07-21 DIAGNOSIS — Z87891 Personal history of nicotine dependence: Secondary | ICD-10-CM | POA: Diagnosis not present

## 2013-07-21 DIAGNOSIS — Z87440 Personal history of urinary (tract) infections: Secondary | ICD-10-CM | POA: Diagnosis not present

## 2013-07-21 MED ORDER — LORAZEPAM 2 MG/ML IJ SOLN
1.0000 mg | Freq: Once | INTRAMUSCULAR | Status: AC
  Administered 2013-07-21: 1 mg via INTRAVENOUS
  Filled 2013-07-21: qty 1

## 2013-07-21 MED ORDER — SODIUM CHLORIDE 0.9 % IV SOLN
1000.0000 mL | Freq: Once | INTRAVENOUS | Status: AC
  Administered 2013-07-21: 1000 mL via INTRAVENOUS

## 2013-07-21 MED ORDER — NAPROXEN 500 MG PO TABS: 500.0000 mg | ORAL_TABLET | Freq: Two times a day (BID) | ORAL | Status: DC

## 2013-07-21 MED ORDER — OXYCODONE-ACETAMINOPHEN 5-325 MG PO TABS: 1.0000 | ORAL_TABLET | ORAL | Status: DC | PRN

## 2013-07-21 MED ORDER — PROMETHAZINE HCL 25 MG PO TABS: 25.0000 mg | ORAL_TABLET | Freq: Four times a day (QID) | ORAL | Status: DC | PRN

## 2013-07-21 MED ORDER — SODIUM CHLORIDE 0.9 % IV SOLN: 1000.0000 mL | INTRAVENOUS | Status: DC

## 2013-07-21 MED ORDER — KETOROLAC TROMETHAMINE 30 MG/ML IJ SOLN
30.0000 mg | Freq: Once | INTRAMUSCULAR | Status: AC
  Administered 2013-07-21: 30 mg via INTRAVENOUS
  Filled 2013-07-21: qty 1

## 2013-07-21 MED ORDER — METOCLOPRAMIDE HCL 5 MG/ML IJ SOLN
10.0000 mg | Freq: Once | INTRAMUSCULAR | Status: AC
  Administered 2013-07-21: 10 mg via INTRAVENOUS
  Filled 2013-07-21: qty 2

## 2013-07-21 MED ORDER — MORPHINE SULFATE 4 MG/ML IJ SOLN
6.0000 mg | Freq: Once | INTRAMUSCULAR | Status: AC
  Administered 2013-07-21: 6 mg via INTRAVENOUS
  Filled 2013-07-21: qty 2

## 2013-07-21 NOTE — ED Notes (Signed)
Pt  C/o HA x3 days. +nausea, denies vomiting, light or sound sensitivity. No Hx HA.

## 2013-07-21 NOTE — ED Provider Notes (Signed)
CSN: [id number]     Arrival date & time 07/21/13  1104 History   First MD Initiated Contact with Patient 07/21/13 1135     Chief Complaint  Patient presents with  . Headache      HPI Patient reports headache over the past 3 days.  She has no significant history of migraine headaches.  This is gradual in onset.  No history of cancer.  No recent fall trauma or injury.  Nausea and vomiting on the way over.  No photophobia or phonophobia.  No neck pain.  No fevers or chills.  No chest pain shortness of breath.  States the headache is located towards the top of her head and is moderate to severe in severity.  No change in her vision.  No recent illness.   Past Medical History  Diagnosis Date  . Hypertension     Patient only takes medication when needed.  Marland Kitchen UTI (urinary tract infection)     history of them  . Abdominal pain   . Nausea   . No appetite   . Redness     around incision site and radiates to the right side of abdomen   . Hernia    Past Surgical History  Procedure Laterality Date  . Abdominal hysterectomy  01/2003  . Bladder suspension  11/2002  . Removal of kidney  2008    left  . Laparoscopic cholecystectomy  11/01/10    Dr Lilyan Punt  . Umbilical hernia repair  11/01/10    Dr Lilyan Punt, no mesh  . Hernia repair    . Cesarean section     Family History  Problem Relation Age of Onset  . Cancer Mother     breast   History  Substance Use Topics  . Smoking status: Former Smoker    Quit date: 05/24/1991  . Smokeless tobacco: Not on file  . Alcohol Use: No   OB History   Grav Para Term Preterm Abortions TAB SAB Ect Mult Living                 Review of Systems  All other systems reviewed and are negative.     Allergies  Cortisone; Ivp dye; and Zofran  Home Medications   Prior to Admission medications   Medication Sig Start Date End Date Taking? Authorizing Provider  hydrochlorothiazide (MICROZIDE) 12.5 MG capsule Take 12.5 mg by mouth daily.   Yes  Historical Provider, MD  omeprazole (PRILOSEC) 20 MG capsule Take 20 mg by mouth daily.    Yes Historical Provider, MD  traMADol (ULTRAM) 50 MG tablet Take 50 mg by mouth 3 (three) times daily as needed for moderate pain.  07/20/13  Yes Historical Provider, MD  naproxen (NAPROSYN) 500 MG tablet Take 1 tablet (500 mg total) by mouth 2 (two) times daily. 07/21/13   Hoy Morn, MD  oxyCODONE-acetaminophen (PERCOCET/ROXICET) 5-325 MG per tablet Take 1 tablet by mouth every 4 (four) hours as needed for severe pain. 07/21/13   Hoy Morn, MD  promethazine (PHENERGAN) 12.5 MG tablet Take 1 tablet (12.5 mg total) by mouth every 6 (six) hours as needed for nausea. 11/04/10 11/11/10  Gayland Curry, MD  promethazine (PHENERGAN) 25 MG tablet Take 1 tablet (25 mg total) by mouth every 6 (six) hours as needed for nausea or vomiting. 07/21/13   Hoy Morn, MD   BP 116/62  Pulse 65  Temp(Src) 97.3 F (36.3 C) (Oral)  Resp 14  SpO2 97% Physical Exam  Nursing note and vitals reviewed. Constitutional: She is oriented to person, place, and time. She appears well-developed and well-nourished. No distress.  HENT:  Head: Normocephalic and atraumatic.  Eyes: EOM are normal. Pupils are equal, round, and reactive to light.  Neck: Normal range of motion.  Cardiovascular: Normal rate, regular rhythm and normal heart sounds.   Pulmonary/Chest: Effort normal and breath sounds normal.  Abdominal: Soft. She exhibits no distension. There is no tenderness.  Musculoskeletal: Normal range of motion.  Neurological: She is alert and oriented to person, place, and time.  5/5 strength in major muscle groups of  bilateral upper and lower extremities. Speech normal. No facial asymetry.   Skin: Skin is warm and dry.  Psychiatric: She has a normal mood and affect. Judgment normal.    ED Course  Procedures (including critical care time) Labs Review Labs Reviewed - No data to display  Imaging Review Ct Head Wo  Contrast  07/21/2013   CLINICAL DATA:  Headache  EXAM: CT HEAD WITHOUT CONTRAST  TECHNIQUE: Contiguous axial images were obtained from the base of the skull through the vertex without intravenous contrast.  COMPARISON:  12/31/2005  FINDINGS: No skull fracture is noted. Paranasal sinuses and mastoid air cells are unremarkable. No intracranial hemorrhage, mass effect or midline shift. No acute cortical infarction. No mass lesion is noted on this unenhanced scan. Mild cerebral atrophy. Patchy subcortical white matter decreased attenuation probable due to chronic small vessel ischemic changes.  IMPRESSION: No acute intracranial abnormality. Mild cerebral atrophy. Patchy subcortical white matter decreased attenuation probable due to chronic small vessel ischemic changes.   Electronically Signed   By: Lahoma Crocker M.D.   On: 07/21/2013 12:15  I personally reviewed the imaging tests through PACS system I reviewed available ER/hospitalization records through the EMR    EKG Interpretation None      MDM   Final diagnoses:  Headache    Discharge home in good condition.  Her headache feels much better.  Head CT demonstrates no bleed or mass.  Normal strength in arms and legs.  Likely a typical migraine.  PCP followup.  She understands return to the ER for new or worsening symptoms.    Hoy Morn, MD 07/21/13 [phone]

## 2013-07-21 NOTE — ED Notes (Signed)
Pt in CT.

## 2013-12-25 ENCOUNTER — Other Ambulatory Visit (HOSPITAL_COMMUNITY): Payer: Self-pay | Admitting: Family Medicine

## 2013-12-25 DIAGNOSIS — Z1231 Encounter for screening mammogram for malignant neoplasm of breast: Secondary | ICD-10-CM

## 2014-01-03 ENCOUNTER — Ambulatory Visit (HOSPITAL_COMMUNITY): Payer: Medicare Other

## 2014-01-06 ENCOUNTER — Ambulatory Visit (HOSPITAL_COMMUNITY): Payer: Medicare Other | Attending: Family Medicine

## 2014-06-13 ENCOUNTER — Other Ambulatory Visit: Payer: Self-pay

## 2014-06-13 NOTE — Addendum Note (Signed)
Addended by: Excell Seltzer T on: 06/13/2014 06:03 PM   Modules accepted: Orders

## 2014-07-03 ENCOUNTER — Other Ambulatory Visit (INDEPENDENT_AMBULATORY_CARE_PROVIDER_SITE_OTHER): Payer: Self-pay

## 2014-07-14 ENCOUNTER — Other Ambulatory Visit: Payer: Self-pay

## 2014-07-14 ENCOUNTER — Ambulatory Visit (HOSPITAL_COMMUNITY)
Admission: RE | Admit: 2014-07-14 | Discharge: 2014-07-14 | Disposition: A | Discharge status: Home / Self Care | Payer: Medicare Other | Source: Ambulatory Visit | Attending: General Surgery | Admitting: General Surgery

## 2014-07-14 DIAGNOSIS — R0600 Dyspnea, unspecified: Secondary | ICD-10-CM | POA: Insufficient documentation

## 2014-07-14 DIAGNOSIS — Z6841 Body Mass Index (BMI) 40.0 and over, adult: Secondary | ICD-10-CM | POA: Insufficient documentation

## 2014-07-14 DIAGNOSIS — I10 Essential (primary) hypertension: Secondary | ICD-10-CM | POA: Insufficient documentation

## 2014-07-14 DIAGNOSIS — M5134 Other intervertebral disc degeneration, thoracic region: Secondary | ICD-10-CM | POA: Diagnosis not present

## 2014-07-17 ENCOUNTER — Ambulatory Visit (INDEPENDENT_AMBULATORY_CARE_PROVIDER_SITE_OTHER): Payer: Medicare Other | Admitting: Psychology

## 2014-07-17 DIAGNOSIS — F4310 Post-traumatic stress disorder, unspecified: Secondary | ICD-10-CM | POA: Diagnosis not present

## 2014-07-17 DIAGNOSIS — E669 Obesity, unspecified: Secondary | ICD-10-CM

## 2014-07-28 ENCOUNTER — Ambulatory Visit (INDEPENDENT_AMBULATORY_CARE_PROVIDER_SITE_OTHER): Payer: No Typology Code available for payment source | Admitting: Psychology

## 2014-07-28 DIAGNOSIS — F508 Other eating disorders: Secondary | ICD-10-CM | POA: Diagnosis not present

## 2014-07-28 DIAGNOSIS — F4310 Post-traumatic stress disorder, unspecified: Secondary | ICD-10-CM

## 2014-07-28 DIAGNOSIS — E669 Obesity, unspecified: Secondary | ICD-10-CM | POA: Diagnosis not present

## 2014-07-30 ENCOUNTER — Ambulatory Visit (HOSPITAL_COMMUNITY)
Admission: RE | Admit: 2014-07-30 | Discharge: 2014-07-30 | Disposition: A | Discharge status: Home / Self Care | Payer: Medicare Other | Source: Ambulatory Visit | Attending: General Surgery | Admitting: General Surgery

## 2014-07-30 ENCOUNTER — Encounter (HOSPITAL_COMMUNITY)
Admission: RE | Disposition: A | Discharge status: Home / Self Care | Payer: Self-pay | Source: Ambulatory Visit | Attending: General Surgery

## 2014-07-30 HISTORY — PX: BREATH TEK H PYLORI: SHX5422

## 2014-07-30 SURGERY — BREATH TEST, FOR HELICOBACTER PYLORI

## 2014-07-31 ENCOUNTER — Encounter (HOSPITAL_COMMUNITY): Payer: Self-pay | Admitting: General Surgery

## 2014-07-31 ENCOUNTER — Encounter: Payer: Medicare Other | Attending: General Surgery | Admitting: Dietician

## 2014-07-31 DIAGNOSIS — Z6841 Body Mass Index (BMI) 40.0 and over, adult: Secondary | ICD-10-CM | POA: Insufficient documentation

## 2014-07-31 DIAGNOSIS — Z713 Dietary counseling and surveillance: Secondary | ICD-10-CM | POA: Insufficient documentation

## 2014-07-31 NOTE — Progress Notes (Signed)
  Pre-Op Assessment Visit:  Pre-Operative RYGB Surgery  Medical Nutrition Therapy:  Appt start time: 2103   End time:  1281.  Patient was seen on 07/31/2014 for Pre-Operative Nutrition Assessment. Assessment and letter of approval faxed to [facility] Surgery Bariatric Surgery Program coordinator on 07/31/2014.   Preferred Learning Style:   No preference indicated   Learning Readiness:   Ready  Handouts given during visit include:  Pre-Op Goals Bariatric Surgery Protein Shakes   During the appointment today the following Pre-Op Goals were reviewed with the patient: Maintain or lose weight as instructed by your surgeon Make healthy food choices Begin to limit portion sizes Limited concentrated sugars and fried foods Keep fat/sugar in the single digits per serving on   food labels Practice CHEWING your food  (aim for 30 chews per bite or until applesauce consistency) Practice not drinking 15 minutes before, during, and 30 minutes after each meal/snack Avoid all carbonated beverages  Avoid/limit caffeinated beverages  Avoid all sugar-sweetened beverages Consume 3 meals per day; eat every 3-5 hours Make a list of non-food related activities Aim for 64-100 ounces of FLUID daily  Aim for at least 60-80 grams of PROTEIN daily Look for a liquid protein source that contain ?15 g protein and ?5 g carbohydrate  (ex: shakes, drinks, shots)  Patient-Centered Goals: Goal: healthier lifestyle, helping out with special needs grandchild  10 level of importance and confidence  Demonstrated degree of understanding via:  Teach Back  Teaching Method Utilized:  Visual Auditory Hands on  Barriers to learning/adherence to lifestyle change: none  Patient to call the [facility] to enroll in Pre-Op and Post-Op Nutrition Education when surgery date is scheduled.

## 2014-07-31 NOTE — Patient Instructions (Signed)

## 2014-08-28 ENCOUNTER — Encounter: Payer: Medicare Other | Attending: General Surgery | Admitting: Dietician

## 2014-08-28 ENCOUNTER — Encounter: Payer: Self-pay | Admitting: Dietician

## 2014-08-28 DIAGNOSIS — Z6841 Body Mass Index (BMI) 40.0 and over, adult: Secondary | ICD-10-CM | POA: Insufficient documentation

## 2014-08-28 DIAGNOSIS — Z713 Dietary counseling and surveillance: Secondary | ICD-10-CM | POA: Diagnosis not present

## 2014-08-28 NOTE — Patient Instructions (Addendum)
Goals: -Work on eating slowly, taking tiny bites, and chewing thoroughly  -Try a Premier protein shake

## 2014-08-28 NOTE — Progress Notes (Signed)
Supervised Weight Loss:  Appt start time: 1135 end time:  1150.  SWL visit 1:  Primary concerns today: Marisa Ryan returns today in preparation for RYGB having lost 2 pounds in the last month. Having a lot of pain in her feet and ankles so she has not been walking. However, she has been playing in her pool with her grandkids. She has been enjoying non starchy vegetables. Has been cutting back on portions of ice cream. She feels like she could do better with eating slowly.   Wt Readings from Last 3 Encounters:  08/28/14 229 lb 4.8 oz (104.01 kg)  07/31/14 231 lb 4.8 oz (104.917 kg)  11/18/10 232 lb 6.4 oz (105.416 kg)    Goals: -Work on eating slowly, taking tiny bites, and chewing thoroughly  -Try a Premier protein shake  Patient-Centered Goals: Goal: healthier lifestyle, helping out with special needs grandchild  10 level of importance and confidence  MEDICATIONS: see list  DIETARY INTAKE: did not assess today  Recent physical activity: playing in pool  Estimated energy needs: 1400-1600 calories  Progress Towards Goal(s):  In progress.   Nutritional Diagnosis:  [place]-3.3 Overweight/obesity related to past poor dietary habits and physical inactivity as evidenced by patient in SWL for pending bariatric surgery following dietary guidelines for continued weight loss.     Intervention:  Nutrition counseling provided.   Monitoring/Evaluation:  Dietary intake, exercise, and body weight in 1 month(s).

## 2014-09-12 ENCOUNTER — Encounter: Payer: Self-pay | Admitting: Cardiovascular Disease

## 2014-09-12 ENCOUNTER — Ambulatory Visit (INDEPENDENT_AMBULATORY_CARE_PROVIDER_SITE_OTHER): Payer: Medicare Other | Admitting: Cardiovascular Disease

## 2014-09-12 VITALS — BP 124/78 | HR 66 | Ht 63.0 in | Wt 228.5 lb

## 2014-09-12 DIAGNOSIS — R0600 Dyspnea, unspecified: Secondary | ICD-10-CM

## 2014-09-12 DIAGNOSIS — R002 Palpitations: Secondary | ICD-10-CM | POA: Insufficient documentation

## 2014-09-12 DIAGNOSIS — R072 Precordial pain: Secondary | ICD-10-CM

## 2014-09-12 DIAGNOSIS — R079 Chest pain, unspecified: Secondary | ICD-10-CM

## 2014-09-12 DIAGNOSIS — E785 Hyperlipidemia, unspecified: Secondary | ICD-10-CM

## 2014-09-12 DIAGNOSIS — I10 Essential (primary) hypertension: Secondary | ICD-10-CM | POA: Diagnosis not present

## 2014-09-12 DIAGNOSIS — R0602 Shortness of breath: Secondary | ICD-10-CM | POA: Diagnosis not present

## 2014-09-12 DIAGNOSIS — Z01818 Encounter for other preprocedural examination: Secondary | ICD-10-CM

## 2014-09-12 DIAGNOSIS — R0609 Other forms of dyspnea: Secondary | ICD-10-CM

## 2014-09-12 HISTORY — DX: Chest pain, unspecified: R07.9

## 2014-09-12 MED ORDER — NITROGLYCERIN 0.4 MG SL SUBL: 0.4000 mg | SUBLINGUAL_TABLET | SUBLINGUAL | Status: DC | PRN

## 2014-09-12 NOTE — Patient Instructions (Addendum)
Your physician has requested that you have en exercise stress myoview. For further information please visit [email or web address]. Please follow instruction sheet, as given.  Marland KitchenLABS- NOTHING TO EAT OR DRINK THE MORNING OF TEST--LIPID, BNP  PRESCRIPTION FOR NTG SUBGLINGUAL TABLETS - SENT TO PHARMACY  Your physician wants you to follow-up in [place].  You will receive a reminder letter in the mail two months in advance. If you don't receive a letter, please call our office to schedule the follow-up appointment.  Nitroglycerin sublingual tablets What is this medicine? NITROGLYCERIN (nye troe GLI ser in) is a type of vasodilator. It relaxes blood vessels, increasing the blood and oxygen supply to your heart. This medicine is used to relieve chest pain caused by angina. It is also used to prevent chest pain before activities like climbing stairs, going outdoors in cold weather, or sexual activity. This medicine may be used for other purposes; ask your health care provider or pharmacist if you have questions. COMMON BRAND NAME(S): Nitroquick, Nitrostat, Nitrotab What should I tell my health care provider before I take this medicine? They need to know if you have any of these conditions: -anemia -head injury, recent stroke, or bleeding in the brain -liver disease -previous heart attack -an unusual or allergic reaction to nitroglycerin, other medicines, foods, dyes, or preservatives -pregnant or trying to get pregnant -breast-feeding How should I use this medicine? Take this medicine by mouth as needed. At the first sign of an angina attack (chest pain or tightness) place one tablet under your tongue. You can also take this medicine 5 to 10 minutes before an event likely to produce chest pain. Follow the directions on the prescription label. Let the tablet dissolve under the tongue. Do not swallow whole. Replace the dose if you accidentally swallow it. It will help if your mouth is not  dry. Saliva around the tablet will help it to dissolve more quickly. Do not eat or drink, smoke or chew tobacco while a tablet is dissolving. If you are not better within 5 minutes after taking ONE dose of nitroglycerin, call 9-1-1 immediately to seek emergency medical care. Do not take more than 3 nitroglycerin tablets over 15 minutes. If you take this medicine often to relieve symptoms of angina, your doctor or health care professional may provide you with different instructions to manage your symptoms. If symptoms do not go away after following these instructions, it is important to call 9-1-1 immediately. Do not take more than 3 nitroglycerin tablets over 15 minutes. Talk to your pediatrician regarding the use of this medicine in children. Special care may be needed. Overdosage: If you think you have taken too much of this medicine contact a poison control center or emergency room at once. NOTE: This medicine is only for you. Do not share this medicine with others. What if I miss a dose? This does not apply. This medicine is only used as needed. What may interact with this medicine? Do not take this medicine with any of the following medications: -certain migraine medicines like ergotamine and dihydroergotamine (DHE) -medicines used to treat erectile dysfunction like sildenafil, tadalafil, and vardenafil -riociguat This medicine may also interact with the following medications: -alteplase -aspirin -heparin -medicines for high blood pressure -medicines for mental depression -other medicines used to treat angina -phenothiazines like chlorpromazine, mesoridazine, prochlorperazine, thioridazine This list may not describe all possible interactions. Give your health care provider a list of all the medicines, herbs, non-prescription drugs, or dietary supplements you use. Also  tell them if you smoke, drink alcohol, or use illegal drugs. Some items may interact with your medicine. What should I watch  for while using this medicine? Tell your doctor or health care professional if you feel your medicine is no longer working. Keep this medicine with you at all times. Sit or lie down when you take your medicine to prevent falling if you feel dizzy or faint after using it. Try to remain calm. This will help you to feel better faster. If you feel dizzy, take several deep breaths and lie down with your feet propped up, or bend forward with your head resting between your knees. You may get drowsy or dizzy. Do not drive, use machinery, or do anything that needs mental alertness until you know how this drug affects you. Do not stand or sit up quickly, especially if you are an older patient. This reduces the risk of dizzy or fainting spells. Alcohol can make you more drowsy and dizzy. Avoid alcoholic drinks. Do not treat yourself for coughs, colds, or pain while you are taking this medicine without asking your doctor or health care professional for advice. Some ingredients may increase your blood pressure. What side effects may I notice from receiving this medicine? Side effects that you should report to your doctor or health care professional as soon as possible: -blurred vision -dry mouth -skin rash -sweating -the feeling of extreme pressure in the head -unusually weak or tired Side effects that usually do not require medical attention (report to your doctor or health care professional if they continue or are bothersome): -flushing of the face or neck -headache -irregular heartbeat, palpitations -nausea, vomiting This list may not describe all possible side effects. Call your doctor for medical advice about side effects. You may report side effects to FDA at 1-800-FDA-1088. Where should I keep my medicine? Keep out of the reach of children. Store at room temperature between 20 and 25 degrees C (68 and 77 degrees F). Store in [facility] [employer]. Protect from light and moisture. Keep tightly closed.  Throw away any unused medicine after the expiration date. NOTE: This sheet is a summary. It may not cover all possible information. If you have questions about this medicine, talk to your doctor, pharmacist, or health care provider.  2015, Elsevier/Gold Standard. (2012-11-22 17:57:36)

## 2014-09-12 NOTE — Progress Notes (Signed)
Cardiology Office Note   Date:  09/12/2014   ID:  Marisa, Ryan 11/15/1955, MRN [id number]  PCP:  Woody Seller, MD  Cardiologist:   Sharol Harness, MD   Chief Complaint  Patient presents with  . Palpitations    ABN EKG SHOWED PVC, NO CHEST DISCOMFORT , LEFT SHOULDER PAIN, FAMILY HISTORY OF STROKES   AND FIRST COUSIN  CABG X4 RECENTLY  . Hypertension    NO SHORTNESS OF BREATH , GASPING FOR AIR AT TIMES  . Medical Clearance    PROCESS OF DOING BARIATIC SURGERY      History of Present Illness: Marisa Ryan is a 59 y.o. female with HTN, PVCs who presents for an evaluation of SOB, nausea and pounding heart.  Over the last 2 weeks Marisa Ryan has noted intermittent episodes of chest pounding and shortness of breath. The chest pounding last for seconds at a time. At times it is associated with exertion and sometimes it occurs with rest. She also notes a sharp CP and L side plain that is non-exertional.  She can reproduce it by pushing on her lower ribs.  In general she feels much more fatigued and is unable to do tasks such as cutting the grass or vacuuming due to shortness of breath. In the past she was able to do these activities without difficulty. She also notices diaphoresis with minimal exertion which is unusual for her. She generally feels fatigued and has had a couple episodes of lightheadedness and presyncope. There is no associated palpitations chest pain or pressure with this episode and it occurred while sitting.  Marisa Ryan was evaluated by her PCP on 8/2 for the above symptoms.  At that time ECG revealed sinus rhythm with one PVC.  CXR was unremarkable.CBC and BMP were wnl, as was her TSH.  Her HCTZ dose was increased to 25mg  at that appointment.  Prior to this she had some mild right lower extremity edema which has improved with hydrochlorothiazide. She denies any PND, but has been sleeping on 3 pillows recently for shortness of breath. Her weight has been stable  overall and she is actually lost 4 pounds in the last couple of weeks. This is intentional weight loss that she has been working with the nutritionist. Marisa Ryan is also interested in pursuing bariatric surgery.  Sclerae has a strong family history of heart disease. Her mother had heart attack and stroke her father also had a heart attack at age 50 is otherwise well now.  Past Medical History  Diagnosis Date  . Hypertension     Patient only takes medication when needed.  Marland Kitchen UTI (urinary tract infection)     history of them  . Abdominal pain   . Nausea   . No appetite   . Redness     around incision site and radiates to the right side of abdomen   . Hernia   . Palpitations   . Morbid obesity   . Chest pain 09/12/2014    Past Surgical History  Procedure Laterality Date  . Abdominal hysterectomy  01/2003  . Bladder suspension  11/2002  . Removal of kidney  2008    left  . Laparoscopic cholecystectomy  11/01/10    Dr Lilyan Punt  . Umbilical hernia repair  11/01/10    Dr Lilyan Punt, no mesh  . Hernia repair    . Cesarean section    . Breath tek h pylori N/A 07/30/2014    Procedure: BREATH TEK  Kandis Ban;  Surgeon: Excell Seltzer, MD;  Location: [facility];  Service: General;  Laterality: N/A;     Current Outpatient Prescriptions  Medication Sig Dispense Refill  . aspirin (ASPIR-81) 81 MG EC tablet Take 81 mg by mouth daily.    . ciprofloxacin (CIPRO) 500 MG tablet Take 500 mg by mouth 2 (two) times daily.    . hydrochlorothiazide (HYDRODIURIL) 25 MG tablet Take 25 mg by mouth daily.    Marland Kitchen omeprazole (PRILOSEC) 40 MG capsule Take 40 mg by mouth daily.    . nitroGLYCERIN (NITROSTAT) 0.4 MG SL tablet Place 1 tablet (0.4 mg total) under the tongue every 5 (five) minutes as needed for chest pain. 25 tablet 6   No current facility-administered medications for this visit.    Allergies:   Cortisone; Ivp dye; and Zofran    Social History:  The patient  reports that she quit smoking about  23 years ago. She does not have any smokeless tobacco history on file. She reports that she does not drink alcohol or use illicit drugs.   Family History:  The patient's family history includes Cancer in her mother; Heart attack (age of onset: 27) in her father; Heart disease (age of onset: 1) in her mother; Hypertension in her brother; Stroke in her maternal aunt, maternal grandmother, and mother.    ROS:  Please see the history of present illness.   Otherwise, review of systems are positive for none.   All other systems are reviewed and negative.    PHYSICAL EXAM: VS:  BP 124/78 mmHg  Pulse 66  Ht 5\' 3"  (1.6 m)  Wt 103.647 kg (228 lb 8 oz)  BMI 40.49 kg/m2 , BMI Body mass index is 40.49 kg/(m^2). GENERAL:  Well appearing.  Obese HEENT:  Pupils equal round and reactive, fundi not visualized, oral mucosa unremarkable NECK:  No jugular venous distention, waveform within normal limits, carotid upstroke brisk and symmetric, no bruits, no thyromegaly LYMPHATICS:  No cervical adenopathy LUNGS:  Clear to auscultation bilaterally HEART:  RRR.  PMI not displaced or sustained,S1 and S2 within normal limits, no S3, no S4, no clicks, no rubs, no murmurs ABD:  Positive bowel sounds normal in frequency in pitch, no bruits, no rebound, no guarding, no midline pulsatile mass, no hepatomegaly, no splenomegaly.  Mild L lower rib cage TTP. EXT:  2 plus pulses throughout, no edema, no cyanosis no clubbing SKIN:  No rashes no nodules NEURO:  Cranial nerves II through XII grossly intact, motor grossly intact throughout PSYCH:  Cognitively intact, oriented to person place and time    EKG:  EKG is ordered today. The ekg ordered today demonstrates sinus rhythm at 66 bpm.  Inferior infarct, age indeterminate.     EKG 09/09/14: Sinus rhythm at 74 bpm.  One PVC. 07/24/14: Sinus rhythm at 67 bpm.  Recent Labs: No results found for requested labs within last 365 days.    Lipid Panel No results found for:  CHOL, TRIG, HDL, CHOLHDL, VLDL, LDLCALC, LDLDIRECT    Wt Readings from Last 3 Encounters:  09/12/14 103.647 kg (228 lb 8 oz)  08/28/14 104.01 kg (229 lb 4.8 oz)  07/31/14 104.917 kg (231 lb 4.8 oz)      Other studies Reviewed: Additional studies/ records that were reviewed today include: medical record. Review of the above records demonstrates:  Please see elsewhere in the note.     ASSESSMENT AND PLAN:  # Chest pain: Ms. Matsuoka symptoms are concerning for angina .  She has several risk factors and her recent exercise intolerance is quite concerning. Therefore we will refer her for nuclear stress testing today.  Her EKG has always shown small Q waves that did not meet criteria for prior infarct however today may seem slightly deeper and wider and there are now small lateral Q waves that do not meet criteria. She has a long-standing history of PVCs. However it is possible that this could also be a sign of ischemia. We will provide a prescription for nitroglycerin. She was instructed on how to use this medication and instructed to seek medical attention immediately if her symptoms do not resolve after the third tablet. - exercise Myoview - check lipids - NTG prn - Continue daily aspirin - BNP  # SOB: Ms. Freese symptoms could be due to heart failure or ischemia. We are pursuing an ischemic eval as above. She previously had lower extremity edema, though this has improved after increasing her diuretic. She does not have elevated jugular venous pressure or lower extremity edema today. However she does report new orthopnea. We will obtain a BNP in order to evaluate for heart failure. Any change in LVEF will be noted on nuclear stress test as above.  # Morbid obesity: Ms. Kuznicki is working with a nutritionist in order to lose weight and has been successful so far. She is also being considered for bariatric surgery. We discussed the need to increase her physical activity once the above symptoms have  improved.   Current medicines are reviewed at length with the patient today.  The patient does not have concerns regarding medicines.  The following changes have been made:  no change  Labs/ tests ordered today include: BNP, Exercise Myoview  Orders Placed This Encounter  Procedures  . B Nat Peptide  . Lipid panel  . Myocardial Perfusion Imaging  . EKG 12-Lead     Disposition:   FU with Dr. Jonelle Sidle C. Oval Linsey in  months   Signed, Sharol Harness, MD  09/12/2014 10:43 AM    [place]

## 2014-09-17 ENCOUNTER — Telehealth (HOSPITAL_COMMUNITY): Payer: Self-pay

## 2014-09-17 NOTE — Telephone Encounter (Signed)
Encounter complete. 

## 2014-09-18 ENCOUNTER — Ambulatory Visit (HOSPITAL_COMMUNITY)
Admission: RE | Admit: 2014-09-18 | Discharge: 2014-09-18 | Disposition: A | Discharge status: Home / Self Care | Payer: Medicare Other | Source: Ambulatory Visit | Attending: Cardiology | Admitting: Cardiology

## 2014-09-18 DIAGNOSIS — R002 Palpitations: Secondary | ICD-10-CM | POA: Insufficient documentation

## 2014-09-18 DIAGNOSIS — R079 Chest pain, unspecified: Secondary | ICD-10-CM

## 2014-09-18 DIAGNOSIS — R0609 Other forms of dyspnea: Secondary | ICD-10-CM

## 2014-09-18 DIAGNOSIS — I10 Essential (primary) hypertension: Secondary | ICD-10-CM | POA: Diagnosis not present

## 2014-09-18 DIAGNOSIS — R0602 Shortness of breath: Secondary | ICD-10-CM | POA: Insufficient documentation

## 2014-09-18 DIAGNOSIS — Z01818 Encounter for other preprocedural examination: Secondary | ICD-10-CM | POA: Diagnosis present

## 2014-09-18 DIAGNOSIS — R0600 Dyspnea, unspecified: Secondary | ICD-10-CM

## 2014-09-18 LAB — MYOCARDIAL PERFUSION IMAGING
CHL CUP RESTING HR STRESS: 67 {beats}/min
CHL RATE OF PERCEIVED EXERTION: 15
CSEPHR: 89 %
Estimated workload: 7 METS
Exercise duration (min): 6 min
LV dias vol: 70 mL
LV sys vol: 20 mL
MPHR: 161 {beats}/min
Peak HR: 144 {beats}/min
SDS: 3
SRS: 1
SSS: 4
TID: 1.06

## 2014-09-18 LAB — LIPID PANEL
CHOL/HDL RATIO: 4.5 ratio (ref <=5.0)
Cholesterol: 187 mg/dL (ref 125–200)
HDL: 42 mg/dL — AB (ref >=46)
LDL Cholesterol: 122 mg/dL (ref <130)
TRIGLYCERIDES: 116 mg/dL (ref <150)
VLDL: 23 mg/dL (ref <30)

## 2014-09-18 MED ORDER — TECHNETIUM TC 99M SESTAMIBI GENERIC - CARDIOLITE
30.2000 | Freq: Once | INTRAVENOUS | Status: AC | PRN
  Administered 2014-09-18: 30.2 via INTRAVENOUS

## 2014-09-18 MED ORDER — TECHNETIUM TC 99M SESTAMIBI GENERIC - CARDIOLITE
10.3000 | Freq: Once | INTRAVENOUS | Status: AC | PRN
  Administered 2014-09-18: 10 via INTRAVENOUS

## 2014-09-19 ENCOUNTER — Telehealth: Payer: Self-pay | Admitting: *Deleted

## 2014-09-19 LAB — BRAIN NATRIURETIC PEPTIDE: BRAIN NATRIURETIC PEPTIDE: 34.3 pg/mL (ref 0.0–100.0)

## 2014-09-19 NOTE — Telephone Encounter (Signed)
Spoke to patient's husband. Result given . Verbalized understanding

## 2014-09-19 NOTE — Telephone Encounter (Signed)
-----   Message from Skeet Latch, MD sent at 09/18/2014  4:34 PM EDT ----- Stress test normal.  There is no evidence of coronary artery disease.

## 2014-09-22 ENCOUNTER — Encounter: Payer: Medicare Other | Attending: General Surgery | Admitting: Dietician

## 2014-09-22 ENCOUNTER — Encounter: Payer: Self-pay | Admitting: Dietician

## 2014-09-22 DIAGNOSIS — Z6841 Body Mass Index (BMI) 40.0 and over, adult: Secondary | ICD-10-CM | POA: Insufficient documentation

## 2014-09-22 DIAGNOSIS — Z713 Dietary counseling and surveillance: Secondary | ICD-10-CM | POA: Insufficient documentation

## 2014-09-22 NOTE — Progress Notes (Signed)
Supervised Weight Loss:  Appt start time: 255 end time:  310  SWL visit 2:  Primary concerns today: Marisa Ryan returns today in preparation for RYGB having gained 1.5 pounds in the last month. She has switched from sugar to Splenda in her tea. She is currently on antibiotics for a UTI. She reports that she is still working on chewing thoroughly and eating slowlybut sometimes forgets. She just finished her second quart of water today; really working on drinking more water. Staying active by playing in the pool with her grandkids. Tried a Premier protein shake and did not like the aftertaste. Provided a protein powder for her to sample.  Samples provided and patient instructed on proper use: Unjury protein powder (chocolate - qty 2) Lot#: [id number] B Exp: 08/2015   Wt Readings from Last 3 Encounters:  09/22/14 230 lb 12.8 oz (104.69 kg)  09/18/14 228 lb (103.42 kg)  09/12/14 228 lb 8 oz (103.647 kg)    Goals: -Work on eating slowly, taking tiny bites, and chewing thoroughly  -Try a protein shake that meets our criteria  Patient-Centered Goals: Goal: healthier lifestyle, helping out with special needs grandchild  10 level of importance and confidence  MEDICATIONS: see list  DIETARY INTAKE: did not assess today  Recent physical activity: playing in pool  Estimated energy needs: 1400-1600 calories  Progress Towards Goal(s):  In progress.   Nutritional Diagnosis:  [place]-3.3 Overweight/obesity related to past poor dietary habits and physical inactivity as evidenced by patient in SWL for pending bariatric surgery following dietary guidelines for continued weight loss.     Intervention:  Nutrition counseling provided.   Monitoring/Evaluation:  Dietary intake, exercise, and body weight in 1 month(s).

## 2014-09-22 NOTE — Patient Instructions (Signed)
Goals: -Work on eating slowly, taking tiny bites, and chewing thoroughly  -Try a protein shake that meets our criteria

## 2014-09-23 ENCOUNTER — Telehealth: Payer: Self-pay | Admitting: *Deleted

## 2014-09-23 NOTE — Telephone Encounter (Signed)
Spoke to patient. Result given . Verbalized understanding  

## 2014-09-23 NOTE — Telephone Encounter (Signed)
-----   Message from Skeet Latch, MD sent at 09/22/2014  4:32 PM EDT ----- Lipid panel is at goal.  10 year risk of cardiovascular disease, heart attack or stroke is less than 5%, so no new medications are recommended.

## 2014-10-17 ENCOUNTER — Ambulatory Visit: Payer: Self-pay | Admitting: Dietician

## 2014-12-08 ENCOUNTER — Encounter: Payer: Self-pay | Admitting: Cardiology

## 2014-12-08 ENCOUNTER — Ambulatory Visit (INDEPENDENT_AMBULATORY_CARE_PROVIDER_SITE_OTHER): Payer: Medicare Other | Admitting: Cardiology

## 2014-12-08 VITALS — BP 142/82 | HR 78 | Ht 62.0 in | Wt 231.0 lb

## 2014-12-08 DIAGNOSIS — G4730 Sleep apnea, unspecified: Secondary | ICD-10-CM | POA: Diagnosis not present

## 2014-12-08 NOTE — Patient Instructions (Signed)
Your physician recommends that you schedule a follow-up appointment in: 2 Months.  Your physician has recommended that you have a sleep study. This test records several body functions during sleep, including: brain activity, eye movement, oxygen and carbon dioxide blood levels, heart rate and rhythm, breathing rate and rhythm, the flow of air through your mouth and nose, snoring, body muscle movements, and chest and belly movement.

## 2014-12-08 NOTE — Progress Notes (Signed)
Cardiology Office Note   Date:  12/08/2014   ID:  Cherica, Heiden 1956-01-26, MRN [id number]  PCP:  Woody Seller, MD  Cardiologist:   Minus Breeding, MD   Chief Complaint  Patient presents with  . Shortness of Breath  . Chest Pain      History of Present Illness: Marisa Ryan is a 59 y.o. female who presents for evaluation of symptoms such shortness of breath. She has had palpitations. She feels like her heart is fluttering. She did see Dr. Oval Linsey and had a stress perfusion study demonstrated an EF of 71%. She had normal wall motion. She does get occasional chest discomfort. She predominantly has dyspnea with activity such as walking a mile distance on level ground or 1 flight of stairs. She's not describing PND or orthopnea. She's had significant fatigue. She doesn't sleep well. She does snore. She wakes up not rested. She has daytime somnolence.  Past Medical History  Diagnosis Date  . Hypertension     Patient only takes medication when needed.  Marland Kitchen UTI (urinary tract infection)     history of them  . Abdominal pain   . Nausea   . No appetite   . Redness     around incision site and radiates to the right side of abdomen   . Hernia   . Palpitations   . Morbid obesity ([place])   . Chest pain 09/12/2014    Past Surgical History  Procedure Laterality Date  . Abdominal hysterectomy  01/2003  . Bladder suspension  11/2002  . Removal of kidney  2008    left  . Laparoscopic cholecystectomy  11/01/10    Dr Lilyan Punt  . Umbilical hernia repair  11/01/10    Dr Lilyan Punt, no mesh  . Hernia repair    . Cesarean section    . Breath tek h pylori N/A 07/30/2014    Procedure: BREATH TEK H PYLORI;  Surgeon: Excell Seltzer, MD;  Location: [facility];  Service: General;  Laterality: N/A;     Current Outpatient Prescriptions  Medication Sig Dispense Refill  . aspirin (ASPIR-81) 81 MG EC tablet Take 81 mg by mouth daily.    . ciprofloxacin (CIPRO) 500 MG tablet Take 500 mg  by mouth 2 (two) times daily.    . hydrochlorothiazide (HYDRODIURIL) 25 MG tablet Take 25 mg by mouth daily.    . nitroGLYCERIN (NITROSTAT) 0.4 MG SL tablet Place 1 tablet (0.4 mg total) under the tongue every 5 (five) minutes as needed for chest pain. 25 tablet 6  . omeprazole (PRILOSEC) 40 MG capsule Take 40 mg by mouth daily.     No current facility-administered medications for this visit.    Allergies:   Cortisone; Ivp dye; and Zofran    ROS:  Please see the history of present illness.   Otherwise, review of systems are positive for none.   All other systems are reviewed and negative.    PHYSICAL EXAM: VS:  BP 142/82 mmHg  Pulse 78  Ht 5\' 2"  (1.575 m)  Wt 231 lb (104.781 kg)  BMI 42.24 kg/m2 , BMI Body mass index is 42.24 kg/(m^2). GENERAL:  Well appearing HEENT:  Pupils equal round and reactive, fundi not visualized, oral mucosa unremarkable NECK:  No jugular venous distention, waveform within normal limits, carotid upstroke brisk and symmetric, no bruits, no thyromegaly LYMPHATICS:  No cervical, inguinal adenopathy LUNGS:  Clear to auscultation bilaterally BACK:  No CVA tenderness CHEST:  Unremarkable HEART:  PMI not displaced or sustained,S1 and S2 within normal limits, no S3, no S4, no clicks, no rubs, no murmurs ABD:  Flat, positive bowel sounds normal in frequency in pitch, no bruits, no rebound, no guarding, no midline pulsatile mass, no hepatomegaly, no splenomegaly EXT:  2 plus pulses throughout, no edema, no cyanosis no clubbing SKIN:  No rashes no nodules NEURO:  Cranial nerves II through XII grossly intact, motor grossly intact throughout PSYCH:  Cognitively intact, oriented to person place and time    EKG:  EKG is not ordered today.   Recent Labs: No results found for requested labs within last 365 days.    Lipid Panel    Component Value Date/Time   CHOL 187 09/18/2014 1031   TRIG 116 09/18/2014 1031   HDL 42* 09/18/2014 1031   CHOLHDL 4.5 09/18/2014  1031   VLDL 23 09/18/2014 1031   LDLCALC 122 09/18/2014 1031      Wt Readings from Last 3 Encounters:  12/08/14 231 lb (104.781 kg)  09/22/14 230 lb 12.8 oz (104.69 kg)  09/18/14 228 lb (103.42 kg)      Other studies Reviewed: Additional studies/ records that were reviewed today include: Stress test. Review of the above records demonstrates:  Please see elsewhere in the note.     ASSESSMENT AND PLAN:  FATIGUE:  I suspect the patient has sleep apnea. I will order this. If she does not have sleep apnea I will further evaluate her dyspnea as below.  OBESITY:  She would be at acceptable risk for bariatric surgery  DYSPNEA:  I will plan a CPX if she is not found to have sleep apnea and does not have improvement with this and with weight loss.    Current medicines are reviewed at length with the patient today.  The patient does not have concerns regarding medicines.  The following changes have been made:  As above  Labs/ tests ordered today include:   Orders Placed This Encounter  Procedures  . Nocturnal polysomnography (NPSG)     Disposition:   FU with me in 3 months.     Signed, Minus Breeding, MD  12/08/2014 12:47 PM    [facility]

## 2015-01-22 ENCOUNTER — Emergency Department (HOSPITAL_COMMUNITY): Payer: Medicare Other

## 2015-01-22 ENCOUNTER — Encounter (HOSPITAL_COMMUNITY): Payer: Self-pay | Admitting: Emergency Medicine

## 2015-01-22 ENCOUNTER — Emergency Department (HOSPITAL_COMMUNITY)
Admission: EM | Admit: 2015-01-22 | Discharge: 2015-01-22 | Disposition: A | Discharge status: Home / Self Care | Payer: Medicare Other | Attending: Emergency Medicine | Admitting: Emergency Medicine

## 2015-01-22 ENCOUNTER — Emergency Department (EMERGENCY_DEPARTMENT_HOSPITAL): Payer: Medicare Other

## 2015-01-22 DIAGNOSIS — G8929 Other chronic pain: Secondary | ICD-10-CM | POA: Insufficient documentation

## 2015-01-22 DIAGNOSIS — Z79899 Other long term (current) drug therapy: Secondary | ICD-10-CM | POA: Insufficient documentation

## 2015-01-22 DIAGNOSIS — M79609 Pain in unspecified limb: Secondary | ICD-10-CM | POA: Diagnosis not present

## 2015-01-22 DIAGNOSIS — Z87448 Personal history of other diseases of urinary system: Secondary | ICD-10-CM | POA: Insufficient documentation

## 2015-01-22 DIAGNOSIS — Z8719 Personal history of other diseases of the digestive system: Secondary | ICD-10-CM | POA: Diagnosis not present

## 2015-01-22 DIAGNOSIS — M79662 Pain in left lower leg: Secondary | ICD-10-CM | POA: Diagnosis present

## 2015-01-22 DIAGNOSIS — Z792 Long term (current) use of antibiotics: Secondary | ICD-10-CM | POA: Diagnosis not present

## 2015-01-22 DIAGNOSIS — I10 Essential (primary) hypertension: Secondary | ICD-10-CM | POA: Diagnosis not present

## 2015-01-22 DIAGNOSIS — Z87440 Personal history of urinary (tract) infections: Secondary | ICD-10-CM | POA: Diagnosis not present

## 2015-01-22 DIAGNOSIS — Z872 Personal history of diseases of the skin and subcutaneous tissue: Secondary | ICD-10-CM | POA: Insufficient documentation

## 2015-01-22 DIAGNOSIS — Z7982 Long term (current) use of aspirin: Secondary | ICD-10-CM | POA: Diagnosis not present

## 2015-01-22 HISTORY — DX: Disorder of kidney and ureter, unspecified: N28.9

## 2015-01-22 MED ORDER — TRAMADOL HCL 50 MG PO TABS: 50.0000 mg | ORAL_TABLET | Freq: Four times a day (QID) | ORAL | Status: DC | PRN

## 2015-01-22 NOTE — Discharge Instructions (Signed)
Leg Cramps Leg cramps occur when a muscle or muscles tighten and you have no control over this tightening (involuntary muscle contraction). Muscle cramps can develop in any muscle, but the most common place is in the calf muscles of the leg. Those cramps can occur during exercise or when you are at rest. Leg cramps are painful, and they may last for a few seconds to a few minutes. Cramps may return several times before they finally stop. Usually, leg cramps are not caused by a serious medical problem. In many cases, the cause is not known. Some common causes include:  Overexertion.  Overuse from repetitive motions, or doing the same thing over and over.  Remaining in a certain position for a long period of time.  Improper preparation, form, or technique while performing a sport or an activity.  Dehydration.  Injury.  Side effects of some medicines.  Abnormally low levels of the salts and ions in your blood (electrolytes), especially potassium and calcium. These levels could be low if you are taking water pills (diuretics) or if you are pregnant. HOME CARE INSTRUCTIONS Watch your condition for any changes. Taking the following actions may help to lessen any discomfort that you are feeling:  Stay well-hydrated. Drink enough fluid to keep your urine clear or pale yellow.  Try massaging, stretching, and relaxing the affected muscle. Do this for several minutes at a time.  For tight or tense muscles, use a warm towel, heating pad, or hot shower water directed to the affected area.  If you are sore or have pain after a cramp, applying ice to the affected area may relieve discomfort.  Put ice in a plastic bag.  Place a towel between your skin and the bag.  Leave the ice on for 20 minutes, 2-3 times per day.  Avoid strenuous exercise for several days if you have been having frequent leg cramps.  Make sure that your diet includes the essential minerals for your muscles to work  normally.  Take medicines only as directed by your health care provider. SEEK MEDICAL CARE IF:  Your leg cramps get more severe or more frequent, or they do not improve over time.  Your foot becomes cold, numb, or blue.   This information is not intended to replace advice given to you by your health care provider. Make sure you discuss any questions you have with your health care provider.   Document Released: 03/03/2004 Document Revised: 06/10/2014 Document Reviewed: 01/01/2014 Elsevier Interactive Patient Education 2016 Elsevier Inc[employer]    Musculoskeletal Pain Musculoskeletal pain is muscle and boney aches and pains. These pains can occur in any part of the body. Your caregiver may treat you without knowing the cause of the pain. They may treat you if blood or urine tests, X-rays, and other tests were normal.  CAUSES There is often not a definite cause or reason for these pains. These pains may be caused by a type of germ (virus). The discomfort may also come from overuse. Overuse includes working out too hard when your body is not fit. Boney aches also come from weather changes. Bone is sensitive to atmospheric pressure changes. HOME CARE INSTRUCTIONS   Ask when your test results will be ready. Make sure you get your test results.  Only take over-the-counter or prescription medicines for pain, discomfort, or fever as directed by your caregiver. If you were given medications for your condition, do not drive, operate machinery or power tools, or sign legal documents for 24 hours.  Do not drink alcohol. Do not take sleeping pills or other medications that may interfere with treatment.  Continue all activities unless the activities cause more pain. When the pain lessens, slowly resume normal activities. Gradually increase the intensity and duration of the activities or exercise.  During periods of severe pain, bed rest may be helpful. Lay or sit in any position that is comfortable.  Putting  ice on the injured area.  Put ice in a bag.  Place a towel between your skin and the bag.  Leave the ice on for 15 to 20 minutes, 3 to 4 times a day.  Follow up with your caregiver for continued problems and no reason can be found for the pain. If the pain becomes worse or does not go away, it may be necessary to repeat tests or do additional testing. Your caregiver may need to look further for a possible cause. SEEK IMMEDIATE MEDICAL CARE IF:  You have pain that is getting worse and is not relieved by medications.  You develop chest pain that is associated with shortness or breath, sweating, feeling sick to your stomach (nauseous), or throw up (vomit).  Your pain becomes localized to the abdomen.  You develop any new symptoms that seem different or that concern you. MAKE SURE YOU:   Understand these instructions.  Will watch your condition.  Will get help right away if you are not doing well or get worse.   This information is not intended to replace advice given to you by your health care provider. Make sure you discuss any questions you have with your health care provider.   Document Released: 01/24/2005 Document Revised: 04/18/2011 Document Reviewed: 09/28/2012 Elsevier Interactive Patient Education [employer].

## 2015-01-22 NOTE — ED Provider Notes (Signed)
CSN: [id number]     Arrival date & time 01/22/15  1523 History  By signing my name below, I, Meriel Pica, attest that this documentation has been prepared under the direction and in the presence of  Delsa Grana, PA-C. Electronically Signed: Meriel Pica, ED Scribe. 01/22/2015. 4:42 PM.   Chief Complaint  Patient presents with  . Leg Pain   The history is provided by the patient. No language interpreter was used.   HPI Comments: Marisa Ryan is a 59 y.o. female, with a PMhx of HTN and chronic back pain, who presents to the Emergency Department complaining of constant, 4/10, non-radiating left calf pain onset 2 days ago without injury, that worsened to a 6/10 today with ambulating, and that she describes as a 'tired' and aching feeling in her left calf. The pt reports chronic back pain which is unchagnged from her baseline today. She notes a history of left patellar fracture but reports no pain to anterior left knee. She denies CP, SOB, h/o DVT/PE, exogenous estrogen use, recent surgery, recent long travel or stasis, recent trauma, or redness or swelling to left calf. No overlying skin changes to LLE. She is ambulatory without difficulty. Pt is taking 25 mg HCTZ, which she has not taken her dose of yet today, daily GERD medication, and a daily 81 mg aspirin which she admits to taking sporadically. She has had a negative cardiac workup in the past.    Past Medical History  Diagnosis Date  . Hypertension     Patient only takes medication when needed.  Marland Kitchen UTI (urinary tract infection)     history of them  . Abdominal pain   . Nausea   . No appetite   . Redness     around incision site and radiates to the right side of abdomen   . Hernia   . Palpitations   . Morbid obesity ([place])   . Chest pain 09/12/2014  . Renal disorder    Past Surgical History  Procedure Laterality Date  . Abdominal hysterectomy  01/2003  . Bladder suspension  11/2002  . Removal of kidney  2008    left  .  Laparoscopic cholecystectomy  11/01/10    Dr Lilyan Punt  . Umbilical hernia repair  11/01/10    Dr Lilyan Punt, no mesh  . Hernia repair    . Cesarean section    . Breath tek h pylori N/A 07/30/2014    Procedure: BREATH TEK H PYLORI;  Surgeon: Excell Seltzer, MD;  Location: [facility];  Service: General;  Laterality: N/A;   Family History  Problem Relation Age of Onset  . Cancer Mother     breast  . Heart disease Mother 22    [place]  . Stroke Mother   . Heart attack Father 69  . Hypertension Brother   . Stroke Maternal Aunt   . Stroke Maternal Grandmother    Social History  Substance Use Topics  . Smoking status: Former Smoker    Quit date: 05/24/1991  . Smokeless tobacco: None  . Alcohol Use: No   OB History    No data available     Review of Systems  Constitutional: Negative for fever.  Respiratory: Negative for shortness of breath.   Cardiovascular: Negative for chest pain and leg swelling.  Musculoskeletal: Positive for myalgias ( left calf). Negative for joint swelling, arthralgias and gait problem.  Skin: Negative for color change, pallor, rash and wound.  Hematological: Does not bruise/bleed easily.  All  other systems reviewed and are negative.  Allergies  Cortisone; Ivp dye; and Zofran  Home Medications   Prior to Admission medications   Medication Sig Start Date End Date Taking? Authorizing Provider  aspirin (ASPIR-81) 81 MG EC tablet Take 81 mg by mouth daily. 06/01/11   Historical Provider, MD  ciprofloxacin (CIPRO) 500 MG tablet Take 500 mg by mouth 2 (two) times daily. 09/04/14   Historical Provider, MD  hydrochlorothiazide (HYDRODIURIL) 25 MG tablet Take 25 mg by mouth daily. 11/13/12   Historical Provider, MD  nitroGLYCERIN (NITROSTAT) 0.4 MG SL tablet Place 1 tablet (0.4 mg total) under the tongue every 5 (five) minutes as needed for chest pain. 09/12/14   Skeet Latch, MD  omeprazole (PRILOSEC) 40 MG capsule Take 40 mg by mouth daily. 07/28/10    Historical Provider, MD  traMADol (ULTRAM) 50 MG tablet Take 1 tablet (50 mg total) by mouth every 6 (six) hours as needed. 01/22/15   Delsa Grana, PA-C   BP 140/78 mmHg  Pulse 71  Temp(Src) 98 F (36.7 C) (Oral)  Resp 16  Ht 5\' 3"  (1.6 m)  Wt 230 lb (104.327 kg)  BMI 40.75 kg/m2  SpO2 100% Physical Exam  Constitutional: She is oriented to person, place, and time. She appears well-developed and well-nourished. No distress.  HENT:  Head: Normocephalic and atraumatic.  Right Ear: External ear normal.  Left Ear: External ear normal.  Nose: Nose normal.  Mouth/Throat: Oropharynx is clear and moist. No oropharyngeal exudate.  Eyes: Conjunctivae and EOM are normal. Pupils are equal, round, and reactive to light. Right eye exhibits no discharge. Left eye exhibits no discharge. No scleral icterus.  Neck: Normal range of motion. Neck supple. No JVD present. No tracheal deviation present.  Cardiovascular: Normal rate, regular rhythm and normal heart sounds.   BLE; DP pulses 2+ bilaterally, PT pulses 1+  Pulmonary/Chest: Effort normal and breath sounds normal. No stridor. No respiratory distress. She has no wheezes.  Abdominal: Soft. There is no tenderness.  Musculoskeletal: Normal range of motion. She exhibits tenderness. She exhibits no edema.       Left knee: Normal. She exhibits no swelling, no ecchymosis, no deformity and no erythema.  Tenderness to left calf with palpation and to left popliteal fossa.   Lymphadenopathy:    She has no cervical adenopathy.  Neurological: She is alert and oriented to person, place, and time. No cranial nerve deficit. She exhibits normal muscle tone. Coordination normal.  Skin: Skin is warm and dry. No rash noted. She is not diaphoretic. No erythema. No pallor.  No pallor, erythema, or edema to BLE; symmetrical circumference of BLE.   Psychiatric: She has a normal mood and affect. Her behavior is normal. Judgment and thought content normal.  Nursing note  and vitals reviewed.   ED Course  Procedures  DIAGNOSTIC STUDIES: Oxygen Saturation is 100% on RA, normal by my interpretation.    COORDINATION OF CARE: 4:24 PM Discussed treatment plan which includes to order Xray of left knee and DVT study of LLE with pt. Pt acknowledges and agrees to plan.  5:49 PM DVT study negative.   Imaging Review Dg Knee Complete 4 Views Left  01/22/2015  CLINICAL DATA:  Left knee pain.  No trauma. EXAM: LEFT KNEE - COMPLETE 4+ VIEW COMPARISON:  None. FINDINGS: The joint spaces are maintained. No acute fracture or osteochondral abnormality. No joint effusion. No chondrocalcinosis. IMPRESSION: No acute bony findings, significant degenerative changes or joint effusion. Electronically Signed  By: Marijo Sanes M.D.   On: 01/22/2015 17:01   I have personally reviewed and evaluated these images as part of my medical decision-making.  MDM  Pt with left calf pain without injury, swelling, color change.  Pain at rest and worse with ambulation.  Some pain felt behind left knee.  Worse with palpation. On exam no abnormality of knee, no swelling/edema.   Ordered X-ray and LE duplex to r/o DVT Patient X-Ray negative for obvious fracture or dislocation.   LE duplex negative.   Will prescribe a short course of tramadol for pain.    Patient will be discharged home, will follow up with PCP.  She is agreeable with above plan. Returns precautions discussed. Pt is stable, with normal vital signs.  Final diagnoses:  Calf pain, left   I personally performed the services described in this documentation, which was scribed in my presence. The recorded information has been reviewed and is accurate.      Delsa Grana, PA-C 01/22/15 2105  Alfonzo Beers, MD 01/22/15 2114

## 2015-01-22 NOTE — ED Notes (Signed)
Pt presents to ED for left calf pain with or without walking.  Pt denies injury, states pain has been going on for a few days.  No swelling noted.

## 2015-01-22 NOTE — ED Notes (Signed)
Pt able to ambulate independently 

## 2015-01-22 NOTE — Progress Notes (Signed)
Preliminary results by tech - Left Lower Ext. Venous Duplex Completed. Negative for deep and superficial vein thrombosis in the left leg. Zanobia Griebel, BS, RDMS, RVT  

## 2015-02-23 ENCOUNTER — Encounter (HOSPITAL_BASED_OUTPATIENT_CLINIC_OR_DEPARTMENT_OTHER): Payer: Self-pay

## 2015-03-01 ENCOUNTER — Ambulatory Visit (HOSPITAL_BASED_OUTPATIENT_CLINIC_OR_DEPARTMENT_OTHER): Payer: Medicare Other | Attending: Cardiology

## 2015-03-01 DIAGNOSIS — Z79899 Other long term (current) drug therapy: Secondary | ICD-10-CM | POA: Insufficient documentation

## 2015-03-01 DIAGNOSIS — Z7982 Long term (current) use of aspirin: Secondary | ICD-10-CM | POA: Diagnosis not present

## 2015-03-01 DIAGNOSIS — G4730 Sleep apnea, unspecified: Secondary | ICD-10-CM

## 2015-03-01 DIAGNOSIS — G4733 Obstructive sleep apnea (adult) (pediatric): Secondary | ICD-10-CM | POA: Insufficient documentation

## 2015-03-01 DIAGNOSIS — R0683 Snoring: Secondary | ICD-10-CM | POA: Insufficient documentation

## 2015-03-01 DIAGNOSIS — G4710 Hypersomnia, unspecified: Secondary | ICD-10-CM | POA: Insufficient documentation

## 2015-03-03 ENCOUNTER — Ambulatory Visit (INDEPENDENT_AMBULATORY_CARE_PROVIDER_SITE_OTHER): Payer: Medicare Other | Admitting: Cardiology

## 2015-03-03 ENCOUNTER — Encounter: Payer: Self-pay | Admitting: Cardiology

## 2015-03-03 VITALS — BP 102/70 | HR 80 | Ht 63.0 in | Wt 232.8 lb

## 2015-03-03 DIAGNOSIS — R0600 Dyspnea, unspecified: Secondary | ICD-10-CM | POA: Diagnosis not present

## 2015-03-03 NOTE — Patient Instructions (Signed)
Dr Percival Spanish recommends that you have a cardiopulmonary exercise test. This will be performed at [facility].  Your physician recommends that you schedule a follow-up appointment in 6 months. You will receive a reminder letter in the mail two months in advance. If you don't receive a letter, please call our office to schedule the follow-up appointment.  If you need a refill on your cardiac medications before your next appointment, please call your pharmacy.  Cardiopulmonary Stress Test Cardiopulmonary stress testing looks at how your body uses oxygen and how it responds to exercise. The test will evaluate your:  Heart.  Lungs.  Muscles. INDICATIONS A cardiopulmonary stress test is usually indicated for the following:  Evaluation of unexplained shortness of breath.  Evaluation of exercise tolerance for a person with heart failure or coronary artery disease (CAD).  Evaluation of lung function. This is useful in patients with chronic obstructive pulmonary disease (COPD) or pulmonary hypertension.  To evaluate how fit you are.  To assess your eligibility for disability.  Preoperative evaluation for:  Heart surgery.  Lung surgery.  Heart/Lung transplants. BEFORE THE PROCEDURE  Do not eat for at least two hours prior to the test or as told by your caregiver.  Do not drink alcohol 24 hours prior to the test.  Do not smoke, drink caffeinated or carbonated beverages for at least 6 hours prior to the test.  Wear loose, comfortable clothing and shoes.  If you use an inhaler, you should bring it with you to the test. PROCEDURE  Electrodes will be attached to your chest. These will be attached to a monitor that will monitor your heart rate.  A nose clip will be applied to your nose.  A headpiece will be placed on your head. This will hold a breathing tube in place that you will breath through during the test.  You will exercise using either a bicycle or a  treadmill.  Usually the study will begin with a few minutes of simple exercise followed by increased exercise.  Your blood pressure, heart rate and breathing will be monitored during and after the test. You will go home when your caregiver feels it is safe for you to do so. SEEK IMMEDIATE MEDICAL CARE IF:  You develop pain or pressure in the following areas:  Chest.  Jaw or neck.  Between your shoulder blades.  Pain radiating down your left arm.  You develop nausea (feeling sick to your stomach).  You develop vomiting.  You develop fainting or dizziness.  You develop shortness of breath. MAKE SURE YOU:   Understand these instructions.  Will watch your condition.  Will get help right away if you are not doing well or get worse.   This information is not intended to replace advice given to you by your health care provider. Make sure you discuss any questions you have with your health care provider.   Document Released: 01/12/2009 Document Revised: 02/14/2014 Document Reviewed: 01/12/2009 Elsevier Interactive Patient Education [employer].

## 2015-03-03 NOTE — Progress Notes (Signed)
Cardiology Office Note   Date:  03/03/2015   ID:  [name], [name] [birth year], MRN [id number]  PCP:  Woody Seller, MD  Cardiologist:   Minus Breeding, MD   No chief complaint on file.     History of Present Illness: Marisa Ryan is a 60 y.o. female who presents for evaluation of symptoms such shortness of breath.She had a stress perfusion study demonstrated an EF of 71%. She had normal wall motion. She predominantly has dyspnea with activity such as walking a mile distance on level ground or 1 flight of stairs.  She gets profoundly fatigued when walking on the treadmill.  She's not describing PND or orthopnea.   She was evaluated with a sleep study but I don't yet have these results.  Past Medical History  Diagnosis Date  . Hypertension     Patient only takes medication when needed.  Marland Kitchen UTI (urinary tract infection)     history of them  . Abdominal pain   . Nausea   . No appetite   . Redness     around incision site and radiates to the right side of abdomen   . Hernia   . Palpitations   . Morbid obesity ([place])   . Chest pain 09/12/2014  . Renal disorder     Past Surgical History  Procedure Laterality Date  . Abdominal hysterectomy  01/2003  . Bladder suspension  11/2002  . Removal of kidney  2008    left  . Laparoscopic cholecystectomy  11/01/10    Dr Lilyan Punt  . Umbilical hernia repair  11/01/10    Dr Lilyan Punt, no mesh  . Hernia repair    . Cesarean section    . Breath tek h pylori N/A 07/30/2014    Procedure: BREATH TEK H PYLORI;  Surgeon: Excell Seltzer, MD;  Location: [facility];  Service: General;  Laterality: N/A;     Current Outpatient Prescriptions  Medication Sig Dispense Refill  . aspirin (ASPIR-81) 81 MG EC tablet Take 81 mg by mouth daily.    . hydrochlorothiazide (HYDRODIURIL) 25 MG tablet Take 25 mg by mouth daily.    Marland Kitchen omeprazole (PRILOSEC) 40 MG capsule Take 40 mg by mouth daily.    . traMADol (ULTRAM) 50 MG tablet Take 1 tablet (50 mg  total) by mouth every 6 (six) hours as needed. 6 tablet 0   No current facility-administered medications for this visit.    Allergies:   Cortisone; Ivp dye; and Zofran    ROS:  Please see the history of present illness.   Otherwise, review of systems are positive for none.   All other systems are reviewed and negative.    PHYSICAL EXAM: VS:  BP 102/70 mmHg  Pulse 80  Ht 5\' 3"  (1.6 m)  Wt 232 lb 12.8 oz (105.597 kg)  BMI 41.25 kg/m2 , BMI Body mass index is 41.25 kg/(m^2). GENERAL:  Well appearing NECK:  No jugular venous distention, waveform within normal limits, carotid upstroke brisk and symmetric, no bruits, no thyromegaly LUNGS:  Clear to auscultation bilaterally BACK:  No CVA tenderness CHEST:  Unremarkable HEART:  PMI not displaced or sustained,S1 and S2 within normal limits, no S3, no S4, no clicks, no rubs, no murmurs ABD:  Flat, positive bowel sounds normal in frequency in pitch, no bruits, no rebound, no guarding, no midline pulsatile mass, no hepatomegaly, no splenomegaly EXT:  2 plus pulses throughout, no edema, no cyanosis no clubbing    EKG:  EKG is not ordered today.   Recent Labs: No results found for requested labs within last 365 days.    Lipid Panel    Component Value Date/Time   CHOL 187 09/18/2014 1031   TRIG 116 09/18/2014 1031   HDL 42* 09/18/2014 1031   CHOLHDL 4.5 09/18/2014 1031   VLDL 23 09/18/2014 1031   LDLCALC 122 09/18/2014 1031      Wt Readings from Last 3 Encounters:  03/03/15 232 lb 12.8 oz (105.597 kg)  01/22/15 230 lb (104.327 kg)  12/08/14 231 lb (104.781 kg)      Other studies Reviewed: Additional studies/ records that were reviewed today include: Stress test. Review of the above records demonstrates:  Please see elsewhere in the note.     ASSESSMENT AND PLAN:  FATIGUE:  I suspect the patient has sleep apnea and the test results are pending.  We will follow up with this.    OBESITY:  She would be at acceptable risk  for bariatric surgery.    DYSPNEA:  I will plan a CPX .  Current medicines are reviewed at length with the patient today.  The patient does not have concerns regarding medicines.  The following changes have been made:  As above  Labs/ tests ordered today include:     Orders Placed This Encounter  Procedures  . Cardiopulmonary exercise test     Disposition:   FU with me in 6 months.     Signed, Minus Breeding, MD  03/03/2015 1:30 PM    [facility]

## 2015-03-06 ENCOUNTER — Ambulatory Visit (HOSPITAL_COMMUNITY): Payer: Medicare Other | Attending: Cardiology

## 2015-03-06 DIAGNOSIS — R0600 Dyspnea, unspecified: Secondary | ICD-10-CM | POA: Insufficient documentation

## 2015-03-12 ENCOUNTER — Other Ambulatory Visit: Payer: Self-pay | Admitting: *Deleted

## 2015-03-12 ENCOUNTER — Telehealth: Payer: Self-pay | Admitting: *Deleted

## 2015-03-12 DIAGNOSIS — G4733 Obstructive sleep apnea (adult) (pediatric): Secondary | ICD-10-CM

## 2015-03-12 NOTE — Sleep Study (Signed)
Patient Name: Marisa Ryan, Marisa Ryan Date: 03/01/2015 Gender: Female D.O.B: October 31, 1955 Age (years): 59 Referring Provider: Minus Breeding Height (inches): 63 Interpreting Physician: Shelva Majestic MD, ABSM Weight (lbs): 225 RPSGT: Jonna Coup BMI: 40 MRN: [id number] Neck Size: 16.00  CLINICAL INFORMATION Sleep Study Type: NPSG   Indication for sleep study: Loud snoring, nonresorative sleep, waking up gasping, daytime sleepiness   Epworth Sleepiness Score: 9   SLEEP STUDY TECHNIQUE As per the AASM Manual for the Scoring of Sleep and Associated Events v2.3 (April 2016) with a hypopnea requiring 4% desaturations. The channels recorded and monitored were frontal, central and occipital EEG, electrooculogram (EOG), submentalis EMG (chin), nasal and oral airflow, thoracic and abdominal wall motion, anterior tibialis EMG, snore microphone, electrocardiogram, and pulse oximetry.  MEDICATIONS  aspirin (ASPIR-81) 81 MG EC tablet 81 mg, Daily     Note: Received from: [place]: Take by mouth. (Written 09/12/2014 0925)   ciprofloxacin (CIPRO) 500 MG tablet 500 mg, 2 times daily     Note: Received from: [place]: Take by mouth. (Written 09/12/2014 0925)   hydrochlorothiazide (HYDRODIURIL) 25 MG tablet 25 mg, Daily     Note: Received from: [place]: Take by mouth. (Written 09/12/2014 0925)   nitroGLYCERIN (NITROSTAT) 0.4 MG SL tablet 0.4 mg, Every 5 min PRN     Note: As needed. Pt states she has not taken it since she filled it (Written 01/22/2015 1620)   Patient not taking. Reported on 03/03/2015   omeprazole (PRILOSEC) 40 MG capsule 40 mg, Daily     Note: Received from: [place]: Take by mouth. (Written 09/12/2014 0925)   traMADol (ULTRAM) 50 MG tablet 50 mg, Every 6 hours PRN   Patient's medications include: N/A. Medications self-administered by patient  during sleep study : No sleep medicine administered.  SLEEP ARCHITECTURE The study was initiated at 9:41:24 PM and ended at 3:42:02 AM. Sleep onset time was 75.1 minutes and the sleep efficiency was 50.9%. The total sleep time was 183.5 minutes. Wake after sleep onset was 183.5 minutes. Stage REM latency was 263.0 minutes. The patient spent 4.63% of the night in stage N1 sleep, 90.19% in stage N2 sleep, 2.18% in stage N3 and 3.00% in REM. Alpha intrusion was absent. Supine sleep was 24.80%.  RESPIRATORY PARAMETERS The overall apnea/hypopnea index (AHI) was 5.2 per hour. There were 2 total apneas, including 2 obstructive, 0 central and 0 mixed apneas. There were 14 hypopneas and 2 RERAs. The AHI during Stage REM sleep was 76.4 per hour. AHI while supine was 21.1 per hour. The mean oxygen saturation was 94.44%. The minimum SpO2 during sleep was 83.00%. Loud snoring was noted during this study.  CARDIAC DATA The 2 lead EKG demonstrated sinus rhythm. The mean heart rate was 64.81 beats per minute. Other EKG findings include: None.  LEG MOVEMENT DATA The total PLMS were 118 with a resulting PLMS index of 38.58. Associated arousal with leg movement index was 12.4 .  IMPRESSIONS - Mild obstructive sleep apnea (AHI = 5.2/h); however, events were moderate with supine sleep and severe during limited REM sleep (AHI 76.4/h). The overall AHI documented may be an underestimate of the severity of the patient's sleep apnea due to significant reduction in REM sleep on this study. - No significant central sleep apnea occurred during this study (CAI = 0.0/h). - Abnormal sleep architecture with a reduction in slow wave and REM sleep and prolonged latency  to REM sleep. - Moderate oxygen desaturation to a nadir of 83.00%. - The patient snored with Loud snoring volume. - No cardiac abnormalities were noted during this study. - The arousal index was abnormal. - Moderate periodic limb movements of sleep  occurred during the study. Associated arousals were significant.  DIAGNOSIS - Obstructive Sleep Apnea (327.23 [G47.33 ICD-10])  RECOMMENDATIONS - In light of the patient's significant symptoms, oxygen desaturation and loud snoring, recommend  CPAP titration for treatment of  sleep apnea/hypopnea syndrome. - Positional therapy avoiding supine position during sleep. - Recommend measures to optimize nasal and oropharyngeal patency. - Avoid alcohol, sedatives and other CNS depressants that may worsen sleep apnea and disrupt normal sleep architecture. - Sleep hygiene should be reviewed to assess factors that may improve sleep quality. - Weight management and regular exercise should be initiated or continued if appropriate. - If patient is symptomatic with restless legs with CPAP therapy,  a trial of medical therapy may be worthwhile.  Troy Sine, MD, [place], American Board of Sleep Medicine  ELECTRONICALLY SIGNED ON:  03/12/2015, 3:53 PM [place] PH: [phone]   FX: [phone] [place]

## 2015-03-12 NOTE — Telephone Encounter (Signed)
Patient notified of sleep study results and recommendations. She voiced her understanding of what was told to her. CPAP titration study ordered.

## 2015-03-12 NOTE — Telephone Encounter (Signed)
-----   Message from Troy Sine, MD sent at 03/12/2015  4:12 PM EST ----- Mariann Laster, please set up for CPAP titration.

## 2015-04-08 ENCOUNTER — Ambulatory Visit (HOSPITAL_BASED_OUTPATIENT_CLINIC_OR_DEPARTMENT_OTHER): Payer: Medicare Other

## 2015-04-11 ENCOUNTER — Ambulatory Visit (HOSPITAL_BASED_OUTPATIENT_CLINIC_OR_DEPARTMENT_OTHER): Payer: Medicare Other | Attending: Cardiovascular Disease

## 2015-04-11 VITALS — Ht 63.0 in | Wt 232.0 lb

## 2015-04-11 DIAGNOSIS — G4730 Sleep apnea, unspecified: Secondary | ICD-10-CM | POA: Diagnosis present

## 2015-04-11 DIAGNOSIS — Z79899 Other long term (current) drug therapy: Secondary | ICD-10-CM | POA: Insufficient documentation

## 2015-04-11 DIAGNOSIS — G4733 Obstructive sleep apnea (adult) (pediatric): Secondary | ICD-10-CM | POA: Diagnosis not present

## 2015-04-11 DIAGNOSIS — R0683 Snoring: Secondary | ICD-10-CM | POA: Insufficient documentation

## 2015-04-11 DIAGNOSIS — Z7982 Long term (current) use of aspirin: Secondary | ICD-10-CM | POA: Diagnosis not present

## 2015-04-12 ENCOUNTER — Encounter (HOSPITAL_BASED_OUTPATIENT_CLINIC_OR_DEPARTMENT_OTHER): Payer: Self-pay

## 2015-05-05 NOTE — Sleep Study (Signed)
Patient Name: Marisa Ryan, Marisa Ryan Date: 04/11/2015 Gender: Female D.O.B: 06-16-1955 Age (years): 39 Referring Provider: Minus Breeding Height (inches): 63 Interpreting Physician: Shelva Majestic MD, ABSM Weight (lbs): 225 RPSGT: Joni Reining BMI: 40 MRN: [id number] Neck Size: 16.00 CLINICAL INFORMATION The patient is referred for a CPAP titration to treat sleep apnea.   Date of NPSG, Split Night or HST:  03/01/2015  Overall AHI 5.2/h; RDI 5.9/h; AHI during REM sleep 76.4/h and in supine sleep 21.1/h  SLEEP STUDY TECHNIQUE As per the AASM Manual for the Scoring of Sleep and Associated Events v2.3 (April 2016) with a hypopnea requiring 4% desaturations. The channels recorded and monitored were frontal, central and occipital EEG, electrooculogram (EOG), submentalis EMG (chin), nasal and oral airflow, thoracic and abdominal wall motion, anterior tibialis EMG, snore microphone, electrocardiogram, and pulse oximetry. Continuous positive airway pressure (CPAP) was initiated at the beginning of the study and titrated to treat sleep-disordered breathing.  MEDICATIONS  aspirin (ASPIR-81) 81 MG EC tablet 81 mg, Daily     Note: Received from: [place]: Take by mouth. (Written 09/12/2014 0925)   hydrochlorothiazide (HYDRODIURIL) 25 MG tablet 25 mg, Daily     Note: Received from: [place]: Take by mouth. (Written 09/12/2014 0925)   omeprazole (PRILOSEC) 40 MG capsule 40 mg, Daily     Note: Received from: [place]: Take by mouth. (Written 09/12/2014 0925)   traMADol (ULTRAM) 50 MG tablet 50 mg, Every 6 hours PRN   Medications administered by patient during sleep study : No sleep medicine administered.  TECHNICIAN COMMENTS Comments added by technician: NONE  Comments added by scorer: N/A  RESPIRATORY PARAMETERS Optimal PAP Pressure (cm): 13 AHI at Optimal Pressure (/hr): 0.0 Overall Minimal  O2 (%): 91.00 Supine % at Optimal Pressure (%): 100 Minimal O2 at Optimal Pressure (%): 95.0    SLEEP ARCHITECTURE The study was initiated at 10:37:27 PM and ended at 4:52:00 AM. Sleep onset time was 12.3 minutes and the sleep efficiency was 80.2%. The total sleep time was 300.5 minutes. The patient spent 3.33% of the night in stage N1 sleep, 52.58% in stage N2 sleep, 27.95% in stage N3 and 16.14% in REM.Stage REM latency was 245.0 minutes Wake after sleep onset was 61.7. Alpha intrusion was absent. Supine sleep was 55.57%.  CARDIAC DATA The 2 lead EKG demonstrated sinus rhythm. The mean heart rate was 61.63 beats per minute. Other EKG findings include: None.  LEG MOVEMENT DATA The total Periodic Limb Movements of Sleep (PLMS) were 13. The PLMS index was 2.60. A PLMS index of <15 is considered normal in adults.  IMPRESSIONS - CPAP was titrated up tp 13 cm water pressure; the optimal CPAP pressure was 13 cm of water with AHI 0 and 02 saturation nadir of 95%. - Central sleep apnea was not noted during this titration (CAI = 0.2/h). - Significant oxygen desaturations were not observed during this titration (min O2 = 91.00%). - Moderate snoring volume resolve at 13 cm water pressure. - No cardiac abnormalities were observed during this study. - Clinically significant periodic limb movements were not noted during this study. Arousals associated with PLMs were rare.  DIAGNOSIS - Obstructive Sleep Apnea (327.23 [G47.33 ICD-10])  RECOMMENDATIONS - Recommend an initial trial of CPAP therapy on 13 cm H2O with  heated humidification.  A Small size Fisher[employer]Paykel Full Face Mask Simplus mask was used for the titration. - Avoid alcohol, sedatives and other CNS depressants  that may worsen sleep apnea and disrupt normal sleep architecture. - Sleep hygiene should be reviewed to assess factors that may improve sleep quality. - Weight management (BMI 40) and regular exercise should be initiated. - The  patient should try to avoid supine sleep.  - Recommend a download be obtained in 30 days and sleep clinic evaluation.   Troy Sine, MD, [place], American Board of Sleep Medicine  ELECTRONICALLY SIGNED ON:  05/05/2015, 6:47 PM [place] PH: [phone]   FX: [phone] [place]

## 2015-05-07 ENCOUNTER — Other Ambulatory Visit: Payer: Self-pay | Admitting: *Deleted

## 2015-05-07 DIAGNOSIS — G4733 Obstructive sleep apnea (adult) (pediatric): Secondary | ICD-10-CM

## 2015-05-07 NOTE — Progress Notes (Signed)
[facility] sent to [facility] for set up via EPIC.

## 2015-05-15 ENCOUNTER — Encounter (HOSPITAL_COMMUNITY): Payer: Self-pay | Admitting: *Deleted

## 2015-05-15 ENCOUNTER — Emergency Department (HOSPITAL_COMMUNITY): Payer: Medicare Other

## 2015-05-15 ENCOUNTER — Emergency Department (HOSPITAL_COMMUNITY)
Admission: EM | Admit: 2015-05-15 | Discharge: 2015-05-15 | Disposition: A | Discharge status: Home / Self Care | Payer: Medicare Other | Attending: Emergency Medicine | Admitting: Emergency Medicine

## 2015-05-15 DIAGNOSIS — Z90710 Acquired absence of both cervix and uterus: Secondary | ICD-10-CM | POA: Insufficient documentation

## 2015-05-15 DIAGNOSIS — Z792 Long term (current) use of antibiotics: Secondary | ICD-10-CM | POA: Diagnosis not present

## 2015-05-15 DIAGNOSIS — Z87891 Personal history of nicotine dependence: Secondary | ICD-10-CM | POA: Insufficient documentation

## 2015-05-15 DIAGNOSIS — I10 Essential (primary) hypertension: Secondary | ICD-10-CM | POA: Diagnosis not present

## 2015-05-15 DIAGNOSIS — K219 Gastro-esophageal reflux disease without esophagitis: Secondary | ICD-10-CM | POA: Insufficient documentation

## 2015-05-15 DIAGNOSIS — Z9049 Acquired absence of other specified parts of digestive tract: Secondary | ICD-10-CM | POA: Insufficient documentation

## 2015-05-15 DIAGNOSIS — K5732 Diverticulitis of large intestine without perforation or abscess without bleeding: Secondary | ICD-10-CM | POA: Insufficient documentation

## 2015-05-15 DIAGNOSIS — Z79899 Other long term (current) drug therapy: Secondary | ICD-10-CM | POA: Diagnosis not present

## 2015-05-15 DIAGNOSIS — Z87448 Personal history of other diseases of urinary system: Secondary | ICD-10-CM | POA: Insufficient documentation

## 2015-05-15 DIAGNOSIS — Z87440 Personal history of urinary (tract) infections: Secondary | ICD-10-CM | POA: Insufficient documentation

## 2015-05-15 DIAGNOSIS — Z791 Long term (current) use of non-steroidal anti-inflammatories (NSAID): Secondary | ICD-10-CM | POA: Insufficient documentation

## 2015-05-15 DIAGNOSIS — R1032 Left lower quadrant pain: Secondary | ICD-10-CM | POA: Diagnosis present

## 2015-05-15 DIAGNOSIS — Z7982 Long term (current) use of aspirin: Secondary | ICD-10-CM | POA: Diagnosis not present

## 2015-05-15 HISTORY — DX: Gastro-esophageal reflux disease without esophagitis: K21.9

## 2015-05-15 LAB — BASIC METABOLIC PANEL
ANION GAP: 11 (ref 5–15)
BUN: 22 mg/dL — ABNORMAL HIGH (ref 6–20)
CALCIUM: 9.1 mg/dL (ref 8.9–10.3)
CHLORIDE: 106 mmol/L (ref 101–111)
CO2: 23 mmol/L (ref 22–32)
Creatinine, Ser: 1.01 mg/dL — ABNORMAL HIGH (ref 0.44–1.00)
GFR calc non Af Amer: 59 mL/min — ABNORMAL LOW (ref >60)
Glucose, Bld: 112 mg/dL — ABNORMAL HIGH (ref 65–99)
POTASSIUM: 3.8 mmol/L (ref 3.5–5.1)
Sodium: 140 mmol/L (ref 135–145)

## 2015-05-15 LAB — CBC WITH DIFFERENTIAL/PLATELET
BASOS ABS: 0 10*3/uL (ref 0.0–0.1)
BASOS PCT: 0 %
Eosinophils Absolute: 0.1 10*3/uL (ref 0.0–0.7)
Eosinophils Relative: 1 %
HEMATOCRIT: 41.7 % (ref 36.0–46.0)
HEMOGLOBIN: 14.2 g/dL (ref 12.0–15.0)
Lymphocytes Relative: 19 %
Lymphs Abs: 1.6 10*3/uL (ref 0.7–4.0)
MCH: 29.1 pg (ref 26.0–34.0)
MCHC: 34.1 g/dL (ref 30.0–36.0)
MCV: 85.5 fL (ref 78.0–100.0)
Monocytes Absolute: 0.6 10*3/uL (ref 0.1–1.0)
Monocytes Relative: 7 %
NEUTROS ABS: 6.3 10*3/uL (ref 1.7–7.7)
NEUTROS PCT: 73 %
Platelets: 193 10*3/uL (ref 150–400)
RBC: 4.88 MIL/uL (ref 3.87–5.11)
RDW: 13.3 % (ref 11.5–15.5)
WBC: 8.7 10*3/uL (ref 4.0–10.5)

## 2015-05-15 LAB — URINALYSIS, ROUTINE W REFLEX MICROSCOPIC
BILIRUBIN URINE: NEGATIVE
Glucose, UA: NEGATIVE mg/dL
Ketones, ur: NEGATIVE mg/dL
NITRITE: POSITIVE — AB
Protein, ur: NEGATIVE mg/dL
SPECIFIC GRAVITY, URINE: 1.022 (ref 1.005–1.030)
pH: 5.5 (ref 5.0–8.0)

## 2015-05-15 LAB — URINE MICROSCOPIC-ADD ON

## 2015-05-15 MED ORDER — HYDROCODONE-ACETAMINOPHEN 5-325 MG PO TABS: 1.0000 | ORAL_TABLET | Freq: Four times a day (QID) | ORAL | Status: DC | PRN

## 2015-05-15 MED ORDER — CIPROFLOXACIN HCL 500 MG PO TABS
500.0000 mg | ORAL_TABLET | Freq: Once | ORAL | Status: AC
  Administered 2015-05-15: 500 mg via ORAL
  Filled 2015-05-15: qty 1

## 2015-05-15 MED ORDER — METRONIDAZOLE 500 MG PO TABS: 500.0000 mg | ORAL_TABLET | Freq: Three times a day (TID) | ORAL | Status: DC

## 2015-05-15 MED ORDER — SODIUM CHLORIDE 0.9 % IV SOLN
INTRAVENOUS | Status: DC
  Administered 2015-05-15: 05:00:00 via INTRAVENOUS

## 2015-05-15 MED ORDER — FENTANYL CITRATE (PF) 100 MCG/2ML IJ SOLN
100.0000 ug | Freq: Once | INTRAMUSCULAR | Status: AC
  Administered 2015-05-15: 100 ug via INTRAVENOUS
  Filled 2015-05-15: qty 2

## 2015-05-15 MED ORDER — CIPROFLOXACIN HCL 500 MG PO TABS: 500.0000 mg | ORAL_TABLET | Freq: Two times a day (BID) | ORAL | Status: DC

## 2015-05-15 MED ORDER — LORAZEPAM 2 MG/ML IJ SOLN
1.0000 mg | Freq: Once | INTRAMUSCULAR | Status: AC
  Administered 2015-05-15: 1 mg via INTRAVENOUS
  Filled 2015-05-15: qty 1

## 2015-05-15 MED ORDER — METRONIDAZOLE IN NACL 5-0.79 MG/ML-% IV SOLN
500.0000 mg | Freq: Once | INTRAVENOUS | Status: AC
  Administered 2015-05-15: 500 mg via INTRAVENOUS
  Filled 2015-05-15: qty 100

## 2015-05-15 NOTE — ED Notes (Signed)
Called lab added on urine culture to existing urine collected

## 2015-05-15 NOTE — Discharge Instructions (Signed)
Diverticulitis °Diverticulitis is inflammation or infection of small pouches in your colon that form when you have a condition called diverticulosis. The pouches in your colon are called diverticula. Your colon, or large intestine, is where water is absorbed and stool is formed. °Complications of diverticulitis can include: °· Bleeding. °· Severe infection. °· Severe pain. °· Perforation of your colon. °· Obstruction of your colon. °CAUSES  °Diverticulitis is caused by bacteria. °Diverticulitis happens when stool becomes trapped in diverticula. This allows bacteria to grow in the diverticula, which can lead to inflammation and infection. °RISK FACTORS °People with diverticulosis are at risk for diverticulitis. Eating a diet that does not include enough fiber from fruits and vegetables may make diverticulitis more likely to develop. °SYMPTOMS  °Symptoms of diverticulitis may include: °· Abdominal pain and tenderness. The pain is normally located on the left side of the abdomen, but may occur in other areas. °· Fever and chills. °· Bloating. °· Cramping. °· Nausea. °· Vomiting. °· Constipation. °· Diarrhea. °· Blood in your stool. °DIAGNOSIS  °Your health care provider will ask you about your medical history and do a physical exam. You may need to have tests done because many medical conditions can cause the same symptoms as diverticulitis. Tests may include: °· Blood tests. °· Urine tests. °· Imaging tests of the abdomen, including X-rays and CT scans. °When your condition is under control, your health care provider may recommend that you have a colonoscopy. A colonoscopy can show how severe your diverticula are and whether something else is causing your symptoms. °TREATMENT  °Most cases of diverticulitis are mild and can be treated at home. Treatment may include: °· Taking over-the-counter pain medicines. °· Following a clear liquid diet. °· Taking antibiotic medicines by mouth for 7-10 days. °More severe cases may  be treated at a hospital. Treatment may include: °· Not eating or drinking. °· Taking prescription pain medicine. °· Receiving antibiotic medicines through an IV tube. °· Receiving fluids and nutrition through an IV tube. °· Surgery. °HOME CARE INSTRUCTIONS  °· Follow your health care provider's instructions carefully. °· Follow a full liquid diet or other diet as directed by your health care provider. After your symptoms improve, your health care provider may tell you to change your diet. He or she may recommend you eat a high-fiber diet. Fruits and vegetables are good sources of fiber. Fiber makes it easier to pass stool. °· Take fiber supplements or probiotics as directed by your health care provider. °· Only take medicines as directed by your health care provider. °· Keep all your follow-up appointments. °SEEK MEDICAL CARE IF:  °· Your pain does not improve. °· You have a hard time eating food. °· Your bowel movements do not return to normal. °SEEK IMMEDIATE MEDICAL CARE IF:  °· Your pain becomes worse. °· Your symptoms do not get better. °· Your symptoms suddenly get worse. °· You have a fever. °· You have repeated vomiting. °· You have bloody or black, tarry stools. °MAKE SURE YOU:  °· Understand these instructions. °· Will watch your condition. °· Will get help right away if you are not doing well or get worse. °  °This information is not intended to replace advice given to you by your health care provider. Make sure you discuss any questions you have with your health care provider. °  °Document Released: 11/03/2004 Document Revised: 01/29/2013 Document Reviewed: 12/19/2012 °Elsevier Interactive Patient Education ©2016 [employer] Inc. ° °

## 2015-05-15 NOTE — ED Notes (Signed)
Waiting for bolus and antibiotic to finish before dc out

## 2015-05-15 NOTE — ED Notes (Signed)
Pt states that she was awakened from sleep approx 3 hrs ago with intense left lower quad pain; pt states that the pain is sharp in nature; pt states that she is nauseous from the pain but denies vomiting; pt had cataract removed on 4/6

## 2015-05-15 NOTE — ED Provider Notes (Signed)
CSN: [id number]     Arrival date & time 05/15/15  0326 History   First MD Initiated Contact with Patient 05/15/15 0407     Chief Complaint  Patient presents with  . Abdominal Pain     (Consider location/radiation/quality/duration/timing/severity/associated sxs/prior Treatment) HPI  This is a 60 year old female status post left nephrectomy and cholecystectomy. She is here with left lower quadrant abdominal pain that began yesterday and is gradually worsened. It is now severe. It is worse with movement or palpation. It is been associated with nausea but no vomiting or diarrhea. She does not have a fever. She has had chills. She is having dysuria. Incidentally the patient had a left cataract removal yesterday.  Past Medical History  Diagnosis Date  . Hypertension     Patient only takes medication when needed.  Marland Kitchen UTI (urinary tract infection)     history of them  . Abdominal pain   . Nausea   . No appetite   . Redness     around incision site and radiates to the right side of abdomen   . Hernia   . Palpitations   . Morbid obesity ([place])   . Chest pain 09/12/2014  . GERD (gastroesophageal reflux disease)   . Renal disorder     left nephrectomy   Past Surgical History  Procedure Laterality Date  . Abdominal hysterectomy  01/2003  . Bladder suspension  11/2002  . Removal of kidney  2008    left  . Laparoscopic cholecystectomy  11/01/10    Dr Lilyan Punt  . Umbilical hernia repair  11/01/10    Dr Lilyan Punt, no mesh  . Hernia repair    . Cesarean section    . Breath tek h pylori N/A 07/30/2014    Procedure: BREATH TEK H PYLORI;  Surgeon: Excell Seltzer, MD;  Location: [facility];  Service: General;  Laterality: N/A;  . Glaucoma surgery     Family History  Problem Relation Age of Onset  . Cancer Mother     breast  . Heart disease Mother 53    [place]  . Stroke Mother   . Heart attack Father 84  . Hypertension Brother   . Stroke Maternal Aunt   . Stroke Maternal Grandmother     Social History  Substance Use Topics  . Smoking status: Former Smoker    Quit date: 05/24/1991  . Smokeless tobacco: None  . Alcohol Use: No   OB History    No data available     Review of Systems  All other systems reviewed and are negative.   Allergies  Cortisone; Ivp dye; and Zofran  Home Medications   Prior to Admission medications   Medication Sig Start Date End Date Taking? Authorizing Provider  aspirin (ASPIR-81) 81 MG EC tablet Take 81 mg by mouth daily. 06/01/11  Yes Historical Provider, MD  Besifloxacin HCl (BESIVANCE) 0.6 % SUSP Apply 1 drop to eye 3 (three) times daily.   Yes Historical Provider, MD  hydrochlorothiazide (HYDRODIURIL) 25 MG tablet Take 25 mg by mouth daily. 11/13/12  Yes Historical Provider, MD  nepafenac (ILEVRO) 0.3 % ophthalmic suspension Place 1 drop into the left eye daily.   Yes Historical Provider, MD  omeprazole (PRILOSEC) 40 MG capsule Take 40 mg by mouth daily. 07/28/10  Yes Historical Provider, MD  traMADol (ULTRAM) 50 MG tablet Take 1 tablet (50 mg total) by mouth every 6 (six) hours as needed. Patient not taking: Reported on 05/15/2015 01/22/15   Delsa Grana,  PA-C   BP 140/81 mmHg  Pulse 74  Temp(Src) 98.8 F (37.1 C) (Oral)  Resp 18  Ht 5\' 3"  (1.6 m)  Wt 230 lb (104.327 kg)  BMI 40.75 kg/m2  SpO2 96%   Physical Exam  General: Well-developed, well-nourished female in no acute distress; appearance consistent with age of record HENT: normocephalic; atraumatic Eyes: Right pupil round and reactive to light, left pupil dilated; extraocular muscles intact Neck: supple Heart: regular rate and rhythm Lungs: clear to auscultation bilaterally Abdomen: soft; nondistended; left lower quadrant tenderness; no masses or hepatosplenomegaly; bowel sounds present Extremities: No deformity; full range of motion; pulses normal Neurologic: Awake, alert and oriented; motor function intact in all extremities and symmetric; no facial droop Skin: Warm  and dry Psychiatric: Normal mood and affect    ED Course  Procedures (including critical care time)   MDM  Nursing notes and vitals signs, including pulse oximetry, reviewed.  Summary of this visit's results, reviewed by myself:  Labs:  Results for orders placed or performed during the hospital encounter of 05/15/15 (from the past 24 hour(s))  Urinalysis, Routine w reflex microscopic (not at [facility])     Status: Abnormal   Collection Time: 05/15/15  4:25 AM  Result Value Ref Range   Color, Urine YELLOW YELLOW   APPearance CLOUDY (A) CLEAR   Specific Gravity, Urine 1.022 1.005 - 1.030   pH 5.5 5.0 - 8.0   Glucose, UA NEGATIVE NEGATIVE mg/dL   Hgb urine dipstick TRACE (A) NEGATIVE   Bilirubin Urine NEGATIVE NEGATIVE   Ketones, ur NEGATIVE NEGATIVE mg/dL   Protein, ur NEGATIVE NEGATIVE mg/dL   Nitrite POSITIVE (A) NEGATIVE   Leukocytes, UA SMALL (A) NEGATIVE  CBC with Differential     Status: None   Collection Time: 05/15/15  4:25 AM  Result Value Ref Range   WBC 8.7 4.0 - 10.5 K/uL   RBC 4.88 3.87 - 5.11 MIL/uL   Hemoglobin 14.2 12.0 - 15.0 g/dL   HCT 41.7 36.0 - 46.0 %   MCV 85.5 78.0 - 100.0 fL   MCH 29.1 26.0 - 34.0 pg   MCHC 34.1 30.0 - 36.0 g/dL   RDW 13.3 11.5 - 15.5 %   Platelets 193 150 - 400 K/uL   Neutrophils Relative % 73 %   Neutro Abs 6.3 1.7 - 7.7 K/uL   Lymphocytes Relative 19 %   Lymphs Abs 1.6 0.7 - 4.0 K/uL   Monocytes Relative 7 %   Monocytes Absolute 0.6 0.1 - 1.0 K/uL   Eosinophils Relative 1 %   Eosinophils Absolute 0.1 0.0 - 0.7 K/uL   Basophils Relative 0 %   Basophils Absolute 0.0 0.0 - 0.1 K/uL  Basic metabolic panel     Status: Abnormal   Collection Time: 05/15/15  4:25 AM  Result Value Ref Range   Sodium 140 135 - 145 mmol/L   Potassium 3.8 3.5 - 5.1 mmol/L   Chloride 106 101 - 111 mmol/L   CO2 23 22 - 32 mmol/L   Glucose, Bld 112 (H) 65 - 99 mg/dL   BUN 22 (H) 6 - 20 mg/dL   Creatinine, Ser 1.01 (H) 0.44 - 1.00 mg/dL   Calcium 9.1  8.9 - 10.3 mg/dL   GFR calc non Af Amer 59 (L) >60 mL/min   GFR calc Af Amer >60 >60 mL/min   Anion gap 11 5 - 15  Urine microscopic-add on     Status: Abnormal   Collection Time: 05/15/15  4:25 AM  Result Value Ref Range   Squamous Epithelial / LPF 0-5 (A) NONE SEEN   WBC, UA 6-30 0 - 5 WBC/hpf   RBC / HPF 0-5 0 - 5 RBC/hpf   Bacteria, UA MANY (A) NONE SEEN    Imaging Studies: Ct Abdomen Pelvis Wo Contrast  05/15/2015  CLINICAL DATA:  Awoke with left lower quadrant pain and nausea. EXAM: CT ABDOMEN AND PELVIS WITHOUT CONTRAST TECHNIQUE: Multidetector CT imaging of the abdomen and pelvis was performed following the standard protocol without IV contrast. COMPARISON:  CT 01/01/2008 FINDINGS: Lower chest: The included lung bases are clear. Pulmonary nodule in the left lower lobe abutting the diaphragm was present on prior exam and considered benign. Liver: Prominent size with mild steatosis. No evidence of focal lesion. Punctate granuloma at the dome. Hepatobiliary: Clips in the gallbladder fossa postcholecystectomy. No intrahepatic biliary ductal dilatation, dilatation of the common bile duct of 1.5 cm is likely sequela of prior cholecystectomy. Pancreas: No ductal dilatation or inflammation. Spleen: Mildly enlarged measuring 13.9 cm craniocaudal. No evidence of focal lesion. Adrenal glands: Right adrenal gland is normal. Left adrenal gland not seen, likely surgically absent. Kidneys: Left nephrectomy. No right hydronephrosis. Question of punctate nonobstructing stone in the interpolar right kidney. No perinephric stranding. Stomach/Bowel: Stomach physiologically distended. There are no dilated or thickened small bowel loops. Inflamed diverticulum at the junction of the sigmoid and descending colon with surrounding fat stranding. No perforation or abscess. Additional noninflamed diverticula most prominent in the sigmoid colon. Moderate stool burden. The appendix is normal. Vascular/Lymphatic: No  retroperitoneal adenopathy. Abdominal aorta is normal in caliber. Mild atherosclerosis without aneurysm. There is a fat containing umbilical hernia. Multiple fat containing ventral abdominal wall hernias, both supra and infraumbilical. Reproductive: Uterus surgically absent.  No adnexal mass. Bladder: Minimally distended, no wall thickening. Other: No free air, free fluid, or intra-abdominal fluid collection. Musculoskeletal: There are no acute or suspicious osseous abnormalities. Postsurgical change at L4-L5. Degenerative change throughout the lumbar spine. IMPRESSION: 1. Acute uncomplicated diverticulitis of the junction of the descending and sigmoid colon. No perforation or abscess. 2. Chronic and incidental findings include post left nephrectomy, mild splenomegaly and multiple fat contained ventral abdominal wall hernias. Electronically Signed   By: Jeb Levering M.D.   On: 05/15/2015 05:15        Shanon Rosser, MD 05/15/15 [phone]

## 2015-05-17 LAB — URINE CULTURE

## 2015-05-18 ENCOUNTER — Telehealth (HOSPITAL_BASED_OUTPATIENT_CLINIC_OR_DEPARTMENT_OTHER): Payer: Self-pay | Admitting: Emergency Medicine

## 2015-05-18 NOTE — Telephone Encounter (Signed)
Post ED Visit - Positive Culture Follow-up  Culture report reviewed by antimicrobial stewardship pharmacist:  []  Elenor Quinones, Pharm.D. []  Heide Guile, Pharm.D., BCPS []  Parks Neptune, Pharm.D. []  Alycia Rossetti, Pharm.D., BCPS []  [place], Pharm.D., BCPS, AAHIVP []  Legrand Como, Pharm.D., BCPS, AAHIVP []  Cassie Stewart, Pharm.D. []  Stephens November, Pharm.Herbie Baltimore PharmD  Positive urine culture Treated with metronidazole and ciprofloxacin, organism sensitive to the same and no further patient follow-up is required at this time.  Hazle Nordmann 05/18/2015, 9:39 AM

## 2015-06-24 ENCOUNTER — Encounter: Payer: Self-pay | Admitting: Gastroenterology

## 2015-07-13 ENCOUNTER — Other Ambulatory Visit: Payer: Self-pay | Admitting: Gastroenterology

## 2015-07-31 ENCOUNTER — Encounter (HOSPITAL_COMMUNITY): Payer: Self-pay

## 2015-07-31 ENCOUNTER — Encounter (HOSPITAL_COMMUNITY)
Admission: RE | Disposition: A | Discharge status: Home / Self Care | Payer: Self-pay | Source: Ambulatory Visit | Attending: Gastroenterology

## 2015-07-31 ENCOUNTER — Ambulatory Visit (HOSPITAL_COMMUNITY)
Admission: RE | Admit: 2015-07-31 | Discharge: 2015-07-31 | Disposition: A | Discharge status: Home / Self Care | Payer: Medicare Other | Source: Ambulatory Visit | Attending: Gastroenterology | Admitting: Gastroenterology

## 2015-07-31 DIAGNOSIS — D120 Benign neoplasm of cecum: Secondary | ICD-10-CM | POA: Insufficient documentation

## 2015-07-31 DIAGNOSIS — I10 Essential (primary) hypertension: Secondary | ICD-10-CM | POA: Diagnosis not present

## 2015-07-31 DIAGNOSIS — Z87891 Personal history of nicotine dependence: Secondary | ICD-10-CM | POA: Diagnosis not present

## 2015-07-31 DIAGNOSIS — Z6841 Body Mass Index (BMI) 40.0 and over, adult: Secondary | ICD-10-CM | POA: Diagnosis not present

## 2015-07-31 DIAGNOSIS — Z1211 Encounter for screening for malignant neoplasm of colon: Secondary | ICD-10-CM | POA: Insufficient documentation

## 2015-07-31 DIAGNOSIS — K5730 Diverticulosis of large intestine without perforation or abscess without bleeding: Secondary | ICD-10-CM | POA: Diagnosis not present

## 2015-07-31 DIAGNOSIS — K219 Gastro-esophageal reflux disease without esophagitis: Secondary | ICD-10-CM | POA: Diagnosis not present

## 2015-07-31 DIAGNOSIS — Z905 Acquired absence of kidney: Secondary | ICD-10-CM | POA: Diagnosis not present

## 2015-07-31 HISTORY — PX: COLONOSCOPY: SHX5424

## 2015-07-31 SURGERY — COLONOSCOPY
Anesthesia: Moderate Sedation

## 2015-07-31 MED ORDER — FENTANYL CITRATE (PF) 100 MCG/2ML IJ SOLN
INTRAMUSCULAR | Status: AC
  Filled 2015-07-31: qty 2

## 2015-07-31 MED ORDER — SODIUM CHLORIDE 0.9 % IV SOLN
INTRAVENOUS | Status: DC
  Administered 2015-07-31: 500 mL via INTRAVENOUS

## 2015-07-31 MED ORDER — MIDAZOLAM HCL 5 MG/5ML IJ SOLN
INTRAMUSCULAR | Status: DC | PRN
  Administered 2015-07-31 (×3): 2 mg via INTRAVENOUS
  Administered 2015-07-31: 1 mg via INTRAVENOUS

## 2015-07-31 MED ORDER — FENTANYL CITRATE (PF) 100 MCG/2ML IJ SOLN
INTRAMUSCULAR | Status: DC | PRN
  Administered 2015-07-31 (×3): 25 ug via INTRAVENOUS

## 2015-07-31 MED ORDER — MIDAZOLAM HCL 5 MG/ML IJ SOLN
INTRAMUSCULAR | Status: AC
  Filled 2015-07-31: qty 2

## 2015-07-31 NOTE — Op Note (Signed)
[facility] Patient Name: Marisa Ryan Procedure Date: 07/31/2015 MRN: [id number] Attending MD: Carol Ada , MD Date of Birth: 08/26/1955 CSN: [id number] Age: 60 Admit Type: Outpatient Procedure:                Colonoscopy Indications:              Screening for colorectal malignant neoplasm Providers:                Carol Ada, MD, Kingsley Plan, RN, Zenon Mayo, RN, Ralene Bathe, Technician Referring MD:              Medicines:                Fentanyl 75 micrograms IV, Midazolam 7 mg IV Complications:            No immediate complications. Estimated Blood Loss:     Estimated blood loss: none. Procedure:                Pre-Anesthesia Assessment:                           - Prior to the procedure, a History and Physical                            was performed, and patient medications and                            allergies were reviewed. The patient's tolerance of                            previous anesthesia was also reviewed. The risks                            and benefits of the procedure and the sedation                            options and risks were discussed with the patient.                            All questions were answered, and informed consent                            was obtained. Prior Anticoagulants: The patient has                            taken no previous anticoagulant or antiplatelet                            agents. ASA Grade Assessment: III - A patient with                            severe systemic disease. After reviewing the risks  and benefits, the patient was deemed in                            satisfactory condition to undergo the procedure.                           - Sedation was administered by an anesthesia                            professional. The sedation level attained was                            moderate.                           - The heart rate,  respiratory rate, oxygen                            saturations, blood pressure, adequacy of pulmonary                            ventilation, and response to care were monitored                            throughout the procedure.                           After obtaining informed consent, the colonoscope                            was passed under direct vision. Throughout the                            procedure, the patient's blood pressure, pulse, and                            oxygen saturations were monitored continuously. The                            EC-[id number]) scope was introduced through                            the anus and advanced to the the cecum, identified                            by appendiceal orifice and ileocecal valve. The                            colonoscopy was performed without difficulty. The                            patient tolerated the procedure well. The quality                            of the bowel preparation was  excellent. The                            ileocecal valve, appendiceal orifice, and rectum                            were photographed. Scope In: 9:13:40 AM Scope Out: 9:33:05 AM Scope Withdrawal Time: 0 hours 11 minutes 2 seconds  Total Procedure Duration: 0 hours 19 minutes 25 seconds  Findings:      A 2 mm polyp was found in the cecum. The polyp was sessile. The polyp       was removed with a cold biopsy forceps. Resection and retrieval were       complete.      Scattered small and large-mouthed diverticula were found in the sigmoid       colon. Impression:               - One 2 mm polyp in the cecum, removed with a cold                            biopsy forceps. Resected and retrieved.                           - Diverticulosis in the sigmoid colon. Moderate Sedation:      Moderate (conscious) sedation was administered by the endoscopy nurse       and supervised by the endoscopist. The following parameters were        monitored: oxygen saturation, heart rate, blood pressure, and response       to care. Recommendation:           - Patient has a contact number available for                            emergencies. The signs and symptoms of potential                            delayed complications were discussed with the                            patient. Return to normal activities tomorrow.                            Written discharge instructions were provided to the                            patient.                           - Resume previous diet.                           - Continue present medications.                           - Await pathology results.                           -  Repeat colonoscopy in 5-10 years for surveillance. Procedure Code(s):        --- Professional ---                           [id number], Colonoscopy, flexible; with biopsy, single                            or multiple Diagnosis Code(s):        --- Professional ---                           Z12.11, Encounter for screening for malignant                            neoplasm of colon                           D12.0, Benign neoplasm of cecum                           K57.30, Diverticulosis of large intestine without                            perforation or abscess without bleeding CPT copyright 2016 [employer]. All rights reserved. The codes documented in this report are preliminary and upon coder review may  be revised to meet current compliance requirements. Carol Ada, MD Carol Ada, MD 07/31/2015 9:38:41 AM This report has been signed electronically. Number of Addenda: 0

## 2015-07-31 NOTE — Discharge Instructions (Signed)

## 2015-07-31 NOTE — H&P (Signed)
  Marisa Ryan HPI: The patient currently denies any issues with hematochezia, melena, constipation, abdominal pain, nausea, vomiting, GERD, or dysphagia. There is no known family history of colon cancer. The patient denies any issues with chest pain, SOB, or MI. Recently she was diagnosed with sleep apnea with an AHI score of 76. She is to receive her CPAP this coming Thursday. Her colonoscopy 10 years ago was normal. On 05/15/2015 the patient was evaluated in the ER for acute LLQ pain that woke her up from sleep. A CT scan revealed an acute diverticulitis at the junction of the descending and sigmoid colon. No WBC elevation with her blood work. She started to have diarrhea earlier this year and it has persisted. .  Past Medical History  Diagnosis Date  . Hypertension     Patient only takes medication when needed.  Marland Kitchen UTI (urinary tract infection)     history of them  . Abdominal pain   . Nausea   . No appetite   . Redness     around incision site and radiates to the right side of abdomen   . Hernia   . Palpitations   . Morbid obesity ([place])   . Chest pain 09/12/2014  . GERD (gastroesophageal reflux disease)   . Renal disorder     left nephrectomy    Past Surgical History  Procedure Laterality Date  . Abdominal hysterectomy  01/2003  . Bladder suspension  11/2002  . Removal of kidney  2008    left  . Laparoscopic cholecystectomy  11/01/10    Dr Lilyan Punt  . Umbilical hernia repair  11/01/10    Dr Lilyan Punt, no mesh  . Hernia repair    . Cesarean section    . Breath tek h pylori N/A 07/30/2014    Procedure: BREATH TEK H PYLORI;  Surgeon: Excell Seltzer, MD;  Location: [facility];  Service: General;  Laterality: N/A;  . Glaucoma surgery      Family History  Problem Relation Age of Onset  . Cancer Mother     breast  . Heart disease Mother 70    [place]  . Stroke Mother   . Heart attack Father 12  . Hypertension Brother   . Stroke Maternal Aunt   . Stroke Maternal  Grandmother     Social History:  reports that she quit smoking about 24 years ago. She does not have any smokeless tobacco history on file. She reports that she does not drink alcohol or use illicit drugs.  Allergies:  Allergies  Allergen Reactions  . Cortisone Anaphylaxis and Shortness Of Breath    Felt hot. Turned face red.  Clementeen Hoof [Iodinated Diagnostic Agents] Anaphylaxis  . Zofran Swelling    Medications: Scheduled: Continuous:  No results found for this or any previous visit (from the past 24 hour(s)).   No results found.  ROS:  As stated above in the HPI otherwise negative.  There were no vitals taken for this visit.    PE: Gen: NAD, Alert and Oriented HEENT:  [place]/AT, EOMI Neck: Supple, no LAD Lungs: CTA Bilaterally CV: RRR without M/G/R ABM: Soft, NTND, +BS Ext: No C/C/E  Assessment/Plan: 1) Screening colonoscopy.  Marisa Ryan 07/31/2015, 7:20 AM

## 2015-08-03 ENCOUNTER — Encounter (HOSPITAL_COMMUNITY): Payer: Self-pay | Admitting: Gastroenterology

## 2015-08-12 ENCOUNTER — Telehealth: Payer: Self-pay | Admitting: Cardiology

## 2015-08-12 NOTE — Telephone Encounter (Signed)
Patient calling w request to adjust her CPAP pressure settings. She thinks the max pressure is too much and she is having significant discomfort with this, feels like her "breath is being taken away".  Notes pressure starts at 5.0 when initiated and goes up to 13.0 through night.   The 5.0 setting took getting used to for her, she feels that even though she had adjusted to this, the 13.0 is too high. DME supplier will not change setting w/o physician order. Notes she hasn't been able to use since Sunday but wants to b/c she knows it is helping. Thinks a pressure adjustment would benefit her and increase her compliance.  Pt aware I will route to prescribing physician (Dr. Claiborne Billings) for advice and recommendations.

## 2015-08-12 NOTE — Telephone Encounter (Signed)
New message       Talk to the nurse about getting her CPAP machine air lowered.

## 2015-08-13 NOTE — Telephone Encounter (Signed)
Ok to reduce CPAP pressure to 10 cm and obtain download in 2-4 weeks.

## 2015-08-13 NOTE — Telephone Encounter (Signed)
Returned call to patient and informed of Dr. Evette Georges ok to reduce CPAP pressure and to do repeat download. Contacted [facility] w/ information and faxed an order for changes to provided number.

## 2015-09-03 NOTE — Progress Notes (Signed)
Cardiology Office Note   Date:  09/03/2015   ID:  Braylan, [place] Sep 15, 1955, MRN [id number]  PCP:  Woody Seller, MD  Cardiologist:   Minus Breeding, MD   No chief complaint on file.     History of Present Illness: Marisa Ryan is a 60 y.o. female who presents for evaluation of symptoms such shortness of breath.She had a stress perfusion study demonstrated an EF of 71%. She had normal wall motion. Since I last saw her she has had treatment with CPAP.  She had CPX which demonstrated probable deconditioning as an etiology to her dyspnea.  She returns for follow up.  She thinks that she is breathing better with the CPAP.  She is exercising in the pool without problems.  The patient denies any new symptoms such as chest discomfort, neck or arm discomfort. There has been no new shortness of breath, PND or orthopnea. There have been no reported palpitations, presyncope or syncope.   Past Medical History:  Diagnosis Date  . Abdominal pain   . Chest pain 09/12/2014  . GERD (gastroesophageal reflux disease)   . Hernia   . Hypertension    Patient only takes medication when needed.  . Morbid obesity ([place])   . Nausea   . No appetite   . Palpitations   . Redness    around incision site and radiates to the right side of abdomen   . Renal disorder    left nephrectomy  . UTI (urinary tract infection)    history of them    Past Surgical History:  Procedure Laterality Date  . ABDOMINAL HYSTERECTOMY  01/2003  . BLADDER SUSPENSION  11/2002  . BREATH TEK H PYLORI N/A 07/30/2014   Procedure: BREATH TEK H PYLORI;  Surgeon: Excell Seltzer, MD;  Location: [facility];  Service: General;  Laterality: N/A;  . CESAREAN SECTION    . COLONOSCOPY N/A 07/31/2015   Procedure: COLONOSCOPY;  Surgeon: Carol Ada, MD;  Location: [facility];  Service: Endoscopy;  Laterality: N/A;  . GLAUCOMA SURGERY    . HERNIA REPAIR    . LAPAROSCOPIC CHOLECYSTECTOMY  11/01/10   Dr Lilyan Punt  . removal of  kidney  2008   left  . UMBILICAL HERNIA REPAIR  11/01/10   Dr Lilyan Punt, no mesh     Current Outpatient Prescriptions  Medication Sig Dispense Refill  . aspirin (ASPIR-81) 81 MG EC tablet Take 81 mg by mouth daily.    . hydrochlorothiazide (HYDRODIURIL) 25 MG tablet Take 25 mg by mouth daily.    Marland Kitchen omeprazole (PRILOSEC) 40 MG capsule Take 40 mg by mouth daily.    . sucralfate (CARAFATE) 1 g tablet Take 1 g by mouth 4 (four) times daily.      No current facility-administered medications for this visit.     Allergies:   Cortisone; Ivp dye [iodinated diagnostic agents]; and Zofran    ROS:  Please see the history of present illness.   Otherwise, review of systems are positive for none.   All other systems are reviewed and negative.    PHYSICAL EXAM: VS:  There were no vitals taken for this visit. , BMI There is no height or weight on file to calculate BMI. GENERAL:  Well appearing NECK:  No jugular venous distention, waveform within normal limits, carotid upstroke brisk and symmetric, no bruits, no thyromegaly LUNGS:  Clear to auscultation bilaterally BACK:  No CVA tenderness CHEST:  Unremarkable HEART:  PMI not displaced or sustained,S1 and  S2 within normal limits, no S3, no S4, no clicks, no rubs, no murmurs ABD:  Flat, positive bowel sounds normal in frequency in pitch, no bruits, no rebound, no guarding, no midline pulsatile mass, no hepatomegaly, no splenomegaly EXT:  2 plus pulses throughout, no edema, no cyanosis no clubbing    EKG:  EKG is  ordered today. Sinus rhythm, rate 69, axis within normal limits, intervals within normal limits, no acute ST-T wave changes.  Recent Labs: 09/18/2014: Brain Natriuretic Peptide 34.3 05/15/2015: BUN 22; Creatinine, Ser 1.01; Hemoglobin 14.2; Platelets 193; Potassium 3.8; Sodium 140    Lipid Panel    Component Value Date/Time   CHOL 187 09/18/2014 1031   TRIG 116 09/18/2014 1031   HDL 42 (L) 09/18/2014 1031   CHOLHDL 4.5 09/18/2014 1031    VLDL 23 09/18/2014 1031   LDLCALC 122 09/18/2014 1031      Wt Readings from Last 3 Encounters:  07/31/15 230 lb (104.3 kg)  05/15/15 230 lb (104.3 kg)  04/12/15 232 lb (105.2 kg)      Other studies Reviewed: Additional studies/ records that were reviewed today include:None Review of the above records demonstrates:  Please see elsewhere in the note.     ASSESSMENT AND PLAN:  FATIGUE:  I suspect the patient has sleep apnea and the test results are pending.  We will follow up with this.    OBESITY:  She would be at acceptable risk for bariatric surgery.    SLEEP APNEA:   This was checked today and she was compliant 90% of the time.  AHI was 0.2.  She is clearly benefiting from therapy.   DYSPNEA:  I will plan a CPX .  Current medicines are reviewed at length with the patient today.  The patient does not have concerns regarding medicines.  The following changes have been made:  As above  Labs/ tests ordered today include:     No orders of the defined types were placed in this encounter.    Disposition:   FU with me in as needed.     Signed, Minus Breeding, MD  09/03/2015 1:47 PM     [facility]

## 2015-09-04 ENCOUNTER — Ambulatory Visit (INDEPENDENT_AMBULATORY_CARE_PROVIDER_SITE_OTHER): Payer: Medicare Other | Admitting: Cardiology

## 2015-09-04 ENCOUNTER — Encounter: Payer: Self-pay | Admitting: Cardiology

## 2015-09-04 VITALS — BP 137/85 | HR 66 | Ht 63.0 in | Wt 230.0 lb

## 2015-09-04 DIAGNOSIS — G4730 Sleep apnea, unspecified: Secondary | ICD-10-CM

## 2015-09-04 DIAGNOSIS — I10 Essential (primary) hypertension: Secondary | ICD-10-CM | POA: Diagnosis not present

## 2015-09-04 DIAGNOSIS — R0600 Dyspnea, unspecified: Secondary | ICD-10-CM

## 2015-09-04 NOTE — Patient Instructions (Signed)
Your physician recommends that you schedule a follow-up appointment in: As Needed    

## 2016-01-05 ENCOUNTER — Other Ambulatory Visit: Payer: Self-pay | Admitting: Family Medicine

## 2016-01-05 DIAGNOSIS — Z1231 Encounter for screening mammogram for malignant neoplasm of breast: Secondary | ICD-10-CM

## 2016-02-09 ENCOUNTER — Ambulatory Visit
Admission: RE | Admit: 2016-02-09 | Discharge: 2016-02-09 | Disposition: A | Discharge status: Home / Self Care | Payer: Medicare Other | Source: Ambulatory Visit | Attending: Family Medicine | Admitting: Family Medicine

## 2016-02-09 DIAGNOSIS — Z1231 Encounter for screening mammogram for malignant neoplasm of breast: Secondary | ICD-10-CM

## 2016-02-11 ENCOUNTER — Other Ambulatory Visit: Payer: Self-pay | Admitting: Surgical Oncology

## 2016-02-11 DIAGNOSIS — I10 Essential (primary) hypertension: Secondary | ICD-10-CM

## 2016-02-11 DIAGNOSIS — IMO0001 Reserved for inherently not codable concepts without codable children: Secondary | ICD-10-CM

## 2016-02-16 ENCOUNTER — Ambulatory Visit
Admission: RE | Admit: 2016-02-16 | Discharge: 2016-02-16 | Disposition: A | Discharge status: Home / Self Care | Payer: Medicare Other | Source: Ambulatory Visit | Attending: Surgical Oncology | Admitting: Surgical Oncology

## 2016-02-16 DIAGNOSIS — I10 Essential (primary) hypertension: Secondary | ICD-10-CM

## 2016-02-16 DIAGNOSIS — IMO0001 Reserved for inherently not codable concepts without codable children: Secondary | ICD-10-CM

## 2016-02-17 ENCOUNTER — Other Ambulatory Visit (HOSPITAL_COMMUNITY): Payer: Self-pay

## 2016-02-17 ENCOUNTER — Other Ambulatory Visit: Payer: Self-pay | Admitting: Physician Assistant

## 2016-02-17 DIAGNOSIS — M79661 Pain in right lower leg: Secondary | ICD-10-CM

## 2016-02-17 DIAGNOSIS — M7989 Other specified soft tissue disorders: Secondary | ICD-10-CM

## 2016-02-17 DIAGNOSIS — M79604 Pain in right leg: Secondary | ICD-10-CM

## 2016-02-18 ENCOUNTER — Ambulatory Visit (HOSPITAL_COMMUNITY)
Admission: RE | Admit: 2016-02-18 | Discharge: 2016-02-18 | Disposition: A | Discharge status: Home / Self Care | Payer: Medicare Other | Source: Ambulatory Visit | Attending: Family Medicine | Admitting: Family Medicine

## 2016-02-18 DIAGNOSIS — M79661 Pain in right lower leg: Secondary | ICD-10-CM | POA: Diagnosis present

## 2016-02-18 DIAGNOSIS — I83811 Varicose veins of right lower extremities with pain: Secondary | ICD-10-CM | POA: Insufficient documentation

## 2016-02-18 DIAGNOSIS — M7989 Other specified soft tissue disorders: Secondary | ICD-10-CM

## 2016-02-18 NOTE — Progress Notes (Signed)
Preliminary results by tech - Right Lower Ext. Venous Duplex Completed. 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 Results given to [place].  Oda Cogan, BS, RDMS, RVT

## 2016-11-01 ENCOUNTER — Telehealth: Payer: Self-pay | Admitting: Cardiology

## 2016-11-01 NOTE — Telephone Encounter (Signed)
Please tell her that her electrocardiogram looks identical with tracings from 2016 and 2017, as far as I can tell no further workup needed especially if she was asymptomatic when the EKG was done. MCr

## 2016-11-01 NOTE — Telephone Encounter (Signed)
New Message     Pt is concerned with the results she was given please call asap

## 2016-11-01 NOTE — Telephone Encounter (Signed)
Spoke w patient, she's aware of provider interpretation of unchanged EKG. Recommended she follow up as needed, but since already scheduled she may keep appt if she wishes. Pt will call should she have further needs in the interim. Pt verbalized understanding and thanks.

## 2016-11-01 NOTE — Telephone Encounter (Signed)
Pt of Dr. Percival Spanish Most recently seen 1 yr ago w instruction to f/u PRN  Spoke w patient. She was seen 10/28/16 (4 days ago) @ [facility] for concern of elevated BP, occasional night sweats. She stated she had an EKG done at primary care office which was concerning because they had to redo the EKG 3 times. Showed result of an ST elevation. She was recommended to return to see [place] next available, and was placed on Luke's schedule for 10/4  Pt calling to see if we can interpret EKG and give her some feedback. She's voiced being anxious regarding this test and wants to know what she should do.  Per patient, no change in severity or frequency symptoms, but she's had same symptoms now for about a week. She denies chest pain, shortness of breath, lightheadedness, fatigue. She states mainly issues of BP variability (running high w/o cause) and that she is at times breaking out into sweats at night and finding it necessary to run the a/c even with house being 65 degrees.  Patient notes her PCP office did not draw/order labwork, but she has noted concern that her thyroid level has been "high normal" in the past. Informed her I would pass this information along.  I got in touch with her family medicine office, they stated they will send EKG - will review this w provider.

## 2016-11-01 NOTE — Telephone Encounter (Signed)
Received records from [place] [facility] for appointment on 11/10/16 with Kerin Ransom PA.  Records put with Luke's schedule for 11/10/16. lp

## 2016-11-07 ENCOUNTER — Ambulatory Visit: Payer: Medicare Other | Admitting: Cardiology

## 2016-11-10 ENCOUNTER — Ambulatory Visit: Payer: Medicare Other | Admitting: Cardiology

## 2016-12-07 ENCOUNTER — Ambulatory Visit (INDEPENDENT_AMBULATORY_CARE_PROVIDER_SITE_OTHER): Payer: Medicare Other | Admitting: Cardiology

## 2016-12-07 ENCOUNTER — Encounter: Payer: Self-pay | Admitting: Cardiology

## 2016-12-07 VITALS — BP 122/78 | HR 71 | Ht 63.0 in | Wt 217.8 lb

## 2016-12-07 DIAGNOSIS — I10 Essential (primary) hypertension: Secondary | ICD-10-CM | POA: Diagnosis not present

## 2016-12-07 DIAGNOSIS — Z91041 Radiographic dye allergy status: Secondary | ICD-10-CM

## 2016-12-07 DIAGNOSIS — R079 Chest pain, unspecified: Secondary | ICD-10-CM | POA: Diagnosis not present

## 2016-12-07 DIAGNOSIS — R9431 Abnormal electrocardiogram [ECG] [EKG]: Secondary | ICD-10-CM

## 2016-12-07 DIAGNOSIS — R0602 Shortness of breath: Secondary | ICD-10-CM

## 2016-12-07 DIAGNOSIS — Z87891 Personal history of nicotine dependence: Secondary | ICD-10-CM

## 2016-12-07 DIAGNOSIS — Z8249 Family history of ischemic heart disease and other diseases of the circulatory system: Secondary | ICD-10-CM | POA: Diagnosis not present

## 2016-12-07 DIAGNOSIS — R5383 Other fatigue: Secondary | ICD-10-CM

## 2016-12-07 NOTE — Assessment & Plan Note (Signed)
H/O of contrast allergy from MRI

## 2016-12-07 NOTE — Assessment & Plan Note (Signed)
Controlled.  

## 2016-12-07 NOTE — Patient Instructions (Signed)
Medication Instructions:  Continue current medications  If you need a refill on your cardiac medications before your next appointment, please call your pharmacy.  Labwork: CBC, BMP, TSH Today HERE IN OUR OFFICE AT [facility]  Testing/Procedures: Your physician has requested that you have an echocardiogram. Echocardiography is a painless test that uses sound waves to create images of your heart. It provides your doctor with information about the size and shape of your heart and how well your heart's chambers and valves are working. This procedure takes approximately one hour. There are no restrictions for this procedure.  Your physician has requested that you have a lexiscan myoview. For further information please visit [email or web address]. Please follow instruction sheet, as given.   Follow-Up: Your physician wants you to follow-up in: 1 Month.    Thank you for choosing [facility] at [facility]!!

## 2016-12-07 NOTE — Progress Notes (Signed)
12/07/2016 Marisa Ryan   Oct 20, 1955  [id number]  Primary Physician Christain Sacramento, MD Primary Cardiologist: Dr Percival Spanish  HPI:  Pleasant 61 y/o morbidly obese female with a history of HTN, OSA-on C-pap, and DOE. She had a cardiopulmonary stress in 2017 that suggested deconditioning and diastolic dysfunction. Recently she saw her PCP and complained of "sweating" and dull chest pain. An EKG was done that the PCP felt was abnormal and the pt was referred to [place]. Before she could make the appointment her father got sick in [place] and she had to go help him. Dr Sallyanne Kuster did review her EKG and felt it was unchanged from previous.   She is seeing me now for follow up. She has vague "dullness" in her chest that seems to be there all the time. She does says she get profusely diaphoretic with any exertion, ie; vacuuming and walking.    Current Outpatient Prescriptions  Medication Sig Dispense Refill  . aspirin (ASPIR-81) 81 MG EC tablet Take 81 mg by mouth daily.    . hydrochlorothiazide (HYDRODIURIL) 25 MG tablet Take 25 mg by mouth daily.    Marland Kitchen omeprazole (PRILOSEC) 40 MG capsule Take 40 mg by mouth daily.     No current facility-administered medications for this visit.     Allergies  Allergen Reactions  . Cortisone Anaphylaxis and Shortness Of Breath    Felt hot. Turned face red.  Clementeen Hoof [Iodinated Diagnostic Agents] Anaphylaxis  . Zofran Swelling    Past Medical History:  Diagnosis Date  . Abdominal pain   . Chest pain 09/12/2014  . GERD (gastroesophageal reflux disease)   . Hernia   . Hypertension    Patient only takes medication when needed.  . Morbid obesity ([place])   . Nausea   . Palpitations   . Renal disorder    left nephrectomy  . UTI (urinary tract infection)    history of them    Social History   Social History  . Marital status: Married    Spouse name: N/A  . Number of children: N/A  . Years of education: N/A   Occupational History  . Not on file.   Social  History Main Topics  . Smoking status: Former Smoker    Quit date: 05/24/1991  . Smokeless tobacco: Never Used  . Alcohol use No  . Drug use: No  . Sexual activity: Not on file   Other Topics Concern  . Not on file   Social History Narrative  . No narrative on file     Family History  Problem Relation Age of Onset  . Cancer Mother        breast  . Heart disease Mother 79       [place]  . Stroke Mother   . Heart attack Father 70  . Hypertension Brother   . Stroke Maternal Aunt   . Stroke Maternal Grandmother      Review of Systems: General: negative for chills, fever, night sweats or weight changes.  Cardiovascular: negative for chest pain, dyspnea on exertion, edema, orthopnea, palpitations, paroxysmal nocturnal dyspnea or shortness of breath Dermatological: negative for rash Respiratory: negative for cough or wheezing Urologic: negative for hematuria Abdominal: negative for nausea, vomiting, diarrhea, bright red blood per rectum, melena, or hematemesis Neurologic: negative for visual changes, syncope, or dizziness All other systems reviewed and are otherwise negative except as noted above.    Blood pressure 122/78, pulse 71, height 5\' 3"  (1.6 m),  weight 217 lb 12.8 oz (98.8 kg).  General appearance: alert, cooperative, no distress and morbidly obese Neck: no carotid bruit and no JVD Lungs: clear to auscultation bilaterally Heart: regular rate and rhythm Abdomen: obese, non tender Extremities: extremities normal, atraumatic, no cyanosis or edema Pulses: 2+ and symmetric Skin: Skin color, texture, turgor normal. No rashes or lesions Neurologic: Grossly normal  EKG NSR  ASSESSMENT AND PLAN:   Chest pain with moderate risk for cardiac etiology Chest discomfort and exertional diaphoresis/o significant CAD  Essential hypertension Controlled  Morbid obesity BMI 38.8  Former smoker Quit in 1993  Family history of coronary artery disease in mother Pt's  mother had PCI in her 11's  Contrast media allergy H/O of contrast allergy from MRI    PLAN  Pt seen by Dr Stanford Breed and myself. He would like labs-CBC, BMP, TSH, echo, and Myoview.  F/U with Dr Percival Spanish after above studies.   Kerin Ransom PA-C 12/07/2016 9:57 AM   As above, patient seen and examined. She is a 61 year old female with past medical history of hypertension, obstructive sleep apnea for evaluation of chest pain. Nuclear study August 2016 was normal. CPX January 2017 suggested limitation related to body habitus and diastolic dysfunction. Patient states that for the past 2 weeks she has noticed diaphoresis with exertion. She also has dyspnea on exertion but no orthopnea, PND or pedal edema. She has had a vague discomfort in her left chest that has been continuous without completely resolving. Because of the above she presented for further evaluation.   Past medical history is significant for hypertension, gastroesophageal reflux disease, obesity.  Past surgical history-She has had prior left nephrectomy.  Family history is significant for her father having a myocardial infarction in his 74s. Her mother had breast cancer.  Social history patient does not consume alcohol. She quit smoking approximately 25 years ago.  Review of systems-patient has had weight loss of 20 pounds but states this was on purpose. She has had diarrhea. She has felt hot with diaphoresis as outlined. Patient denies fevers, chills, productive cough or hemoptysis. She does have dyspnea on exertion. Remaining systems negative.  Physical exam Blood pressure 122/78 and pulse 71. Patient weighs 217 pounds.  Well-developed obese in no acute distress. HEENT is normal with normal eyelids. Neck is supple Skin is warm and dry. Chest clear to auscultation with no wheezing. Cardiovascular exam reveals regular rate and rhythm with no murmurs. Abdominal exam nontender or distended and no masses palpated. Extremities  show no edema. Neurological exam grossly intact.  1 chest pain-symptoms are atypical. They have been continuous and electrocardiogram shows no diagnostic ST changes. Plan Lexiscan nuclear study for risk stratification.  2 dyspnea-we will arrange an echocardiogram to assess LV systolic and diastolic function.  3 heat intolerance/diaphoresis/weight loss-we will check TSH to make sure she is not hyperthyroid. I will also check baseline laboratories.  4 hypertension-blood pressure is controlled. Continue present medications.  5 obesity-we discussed the importance of weight loss.  Kirk Ruths, MD

## 2016-12-07 NOTE — Assessment & Plan Note (Signed)
Quit in 1993

## 2016-12-07 NOTE — Assessment & Plan Note (Signed)
BMI 38.8

## 2016-12-07 NOTE — Assessment & Plan Note (Signed)
Chest discomfort and exertional diaphoresis/o significant CAD

## 2016-12-07 NOTE — Assessment & Plan Note (Signed)
Pt's mother had PCI in her 42's

## 2016-12-08 ENCOUNTER — Encounter: Payer: Self-pay | Admitting: *Deleted

## 2016-12-08 LAB — BASIC METABOLIC PANEL
BUN/Creatinine Ratio: 23 (ref 12–28)
BUN: 21 mg/dL (ref 8–27)
CO2: 24 mmol/L (ref 20–29)
Calcium: 9.4 mg/dL (ref 8.7–10.3)
Chloride: 96 mmol/L (ref 96–106)
Creatinine, Ser: 0.93 mg/dL (ref 0.57–1.00)
GFR calc Af Amer: 76 mL/min/{1.73_m2} (ref >59)
GFR calc non Af Amer: 66 mL/min/{1.73_m2} (ref >59)
Glucose: 87 mg/dL (ref 65–99)
Potassium: 3.9 mmol/L (ref 3.5–5.2)
Sodium: 139 mmol/L (ref 134–144)

## 2016-12-08 LAB — CBC
Hematocrit: 42.8 % (ref 34.0–46.6)
Hemoglobin: 15 g/dL (ref 11.1–15.9)
MCH: 29.8 pg (ref 26.6–33.0)
MCHC: 35 g/dL (ref 31.5–35.7)
MCV: 85 fL (ref 79–97)
Platelets: 227 10*3/uL (ref 150–379)
RBC: 5.04 x10E6/uL (ref 3.77–5.28)
RDW: 13 % (ref 12.3–15.4)
WBC: 8.8 10*3/uL (ref 3.4–10.8)

## 2016-12-08 LAB — TSH: TSH: 2.86 u[IU]/mL (ref 0.450–4.500)

## 2016-12-09 ENCOUNTER — Other Ambulatory Visit: Payer: Self-pay

## 2016-12-09 ENCOUNTER — Ambulatory Visit (HOSPITAL_COMMUNITY): Payer: Medicare Other | Attending: Cardiology

## 2016-12-09 ENCOUNTER — Telehealth (HOSPITAL_COMMUNITY): Payer: Self-pay

## 2016-12-09 DIAGNOSIS — R079 Chest pain, unspecified: Secondary | ICD-10-CM | POA: Insufficient documentation

## 2016-12-09 DIAGNOSIS — R0602 Shortness of breath: Secondary | ICD-10-CM | POA: Diagnosis not present

## 2016-12-09 DIAGNOSIS — K219 Gastro-esophageal reflux disease without esophagitis: Secondary | ICD-10-CM | POA: Insufficient documentation

## 2016-12-09 DIAGNOSIS — Z6838 Body mass index (BMI) 38.0-38.9, adult: Secondary | ICD-10-CM | POA: Insufficient documentation

## 2016-12-09 DIAGNOSIS — E669 Obesity, unspecified: Secondary | ICD-10-CM | POA: Diagnosis not present

## 2016-12-09 DIAGNOSIS — R9431 Abnormal electrocardiogram [ECG] [EKG]: Secondary | ICD-10-CM | POA: Diagnosis not present

## 2016-12-09 DIAGNOSIS — I119 Hypertensive heart disease without heart failure: Secondary | ICD-10-CM | POA: Diagnosis not present

## 2016-12-09 NOTE — Telephone Encounter (Signed)
Encounter complete. 

## 2016-12-13 ENCOUNTER — Telehealth (HOSPITAL_COMMUNITY): Payer: Self-pay

## 2016-12-13 NOTE — Telephone Encounter (Signed)
Encounter completed.

## 2016-12-14 ENCOUNTER — Ambulatory Visit (HOSPITAL_COMMUNITY)
Admission: RE | Admit: 2016-12-14 | Discharge: 2016-12-14 | Disposition: A | Discharge status: Home / Self Care | Payer: Medicare Other | Source: Ambulatory Visit | Attending: Cardiology | Admitting: Cardiology

## 2016-12-14 DIAGNOSIS — K219 Gastro-esophageal reflux disease without esophagitis: Secondary | ICD-10-CM | POA: Diagnosis not present

## 2016-12-14 DIAGNOSIS — R079 Chest pain, unspecified: Secondary | ICD-10-CM

## 2016-12-14 DIAGNOSIS — R9431 Abnormal electrocardiogram [ECG] [EKG]: Secondary | ICD-10-CM

## 2016-12-14 DIAGNOSIS — Z8249 Family history of ischemic heart disease and other diseases of the circulatory system: Secondary | ICD-10-CM | POA: Insufficient documentation

## 2016-12-14 DIAGNOSIS — Z87891 Personal history of nicotine dependence: Secondary | ICD-10-CM | POA: Diagnosis not present

## 2016-12-14 DIAGNOSIS — I10 Essential (primary) hypertension: Secondary | ICD-10-CM | POA: Diagnosis not present

## 2016-12-14 DIAGNOSIS — G4733 Obstructive sleep apnea (adult) (pediatric): Secondary | ICD-10-CM | POA: Diagnosis not present

## 2016-12-14 DIAGNOSIS — R0602 Shortness of breath: Secondary | ICD-10-CM

## 2016-12-14 LAB — MYOCARDIAL PERFUSION IMAGING
LV dias vol: 85 mL (ref 46–106)
LV sys vol: 33 mL
Peak HR: 93 {beats}/min
Rest HR: 55 {beats}/min
SDS: 6
SRS: 0
SSS: 6
TID: 1.08

## 2016-12-14 MED ORDER — TECHNETIUM TC 99M TETROFOSMIN IV KIT
26.8000 | PACK | Freq: Once | INTRAVENOUS | Status: AC | PRN
  Administered 2016-12-14: 26.8 via INTRAVENOUS
  Filled 2016-12-14: qty 27

## 2016-12-14 MED ORDER — AMINOPHYLLINE 25 MG/ML IV SOLN
75.0000 mg | Freq: Once | INTRAVENOUS | Status: AC
  Administered 2016-12-14: 75 mg via INTRAVENOUS

## 2016-12-14 MED ORDER — REGADENOSON 0.4 MG/5ML IV SOLN
0.4000 mg | Freq: Once | INTRAVENOUS | Status: AC
  Administered 2016-12-14: 0.4 mg via INTRAVENOUS

## 2016-12-14 MED ORDER — TECHNETIUM TC 99M TETROFOSMIN IV KIT
8.0000 | PACK | Freq: Once | INTRAVENOUS | Status: AC | PRN
  Administered 2016-12-14: 8 via INTRAVENOUS
  Filled 2016-12-14: qty 8

## 2017-01-04 ENCOUNTER — Encounter: Payer: Self-pay | Admitting: Cardiology

## 2017-01-11 NOTE — Progress Notes (Signed)
Cardiology Office Note   Date:  01/12/2017   ID:  Marisa Ryan, Marisa Ryan April 25, 1955, MRN [id number]  PCP:  Christain Sacramento, MD  Cardiologist:   Minus Breeding, MD   Chief Complaint  Patient presents with  . Chest Pain      History of Present Illness: Marisa Ryan is a 61 y.o. female who presents for evaluation of chest pain.  He had chest pain last month and had a negative Lexiscan Myoview with normal EF.   I saw her in the past.  She had a CPX that suggested deconditioning as a cause of this.  This was in 2017.   She continues to have this pain.  It is there all of the time and a dull pain that radiates to her left shoulder.  She says that worse with movement.  It is unchanged from pain that she has had over time.  Again she had a negative stress test recently and CPX not suggesting coronary disease.   Past Medical History:  Diagnosis Date  . Abdominal pain   . Chest pain 09/12/2014  . GERD (gastroesophageal reflux disease)   . Hernia   . Hypertension    Patient only takes medication when needed.  . Morbid obesity ([place])   . Nausea   . Palpitations   . Renal disorder    left nephrectomy  . UTI (urinary tract infection)    history of them    Past Surgical History:  Procedure Laterality Date  . ABDOMINAL HYSTERECTOMY  01/2003  . BLADDER SUSPENSION  11/2002  . BREATH TEK H PYLORI N/A 07/30/2014   Procedure: BREATH TEK H PYLORI;  Surgeon: Excell Seltzer, MD;  Location: [facility];  Service: General;  Laterality: N/A;  . CESAREAN SECTION    . COLONOSCOPY N/A 07/31/2015   Procedure: COLONOSCOPY;  Surgeon: Carol Ada, MD;  Location: [facility];  Service: Endoscopy;  Laterality: N/A;  . GLAUCOMA SURGERY    . HERNIA REPAIR    . LAPAROSCOPIC CHOLECYSTECTOMY  11/01/10   Dr Lilyan Punt  . removal of kidney  2008   left  . UMBILICAL HERNIA REPAIR  11/01/10   Dr Lilyan Punt, no mesh     Current Outpatient Medications  Medication Sig Dispense Refill  . aspirin (ASPIR-81) 81 MG EC  tablet Take 81 mg by mouth daily.    . hydrochlorothiazide (HYDRODIURIL) 25 MG tablet Take 25 mg by mouth daily.    Marland Kitchen omeprazole (PRILOSEC) 40 MG capsule Take 40 mg by mouth daily.    . traZODone (DESYREL) 50 MG tablet Take 1 tablet by mouth at bedtime as needed.     No current facility-administered medications for this visit.     Allergies:   Cortisone; Ivp dye [iodinated diagnostic agents]; and Zofran    ROS:  Please see the history of present illness.   Otherwise, review of systems are positive for none.   All other systems are reviewed and negative.    PHYSICAL EXAM: VS:  BP 108/68   Pulse 72   Ht 5\' 3"  (1.6 m)   Wt 221 lb (100.2 kg)   BMI 39.15 kg/m  , BMI Body mass index is 39.15 kg/m.  GENERAL:  Well appearing NECK:  No jugular venous distention, waveform within normal limits, carotid upstroke brisk and symmetric, no bruits, no thyromegaly LUNGS:  Clear to auscultation bilaterally CHEST:  Unremarkable HEART:  PMI not displaced or sustained,S1 and S2 within normal limits, no S3, no S4, no clicks,  no rubs, no murmurs ABD:  Flat, positive bowel sounds normal in frequency in pitch, no bruits, no rebound, no guarding, no midline pulsatile mass, no hepatomegaly, no splenomegaly EXT:  2 plus pulses throughout, no edema, no cyanosis no clubbing   EKG:  EKG is  not ordered today.   Recent Labs: 12/07/2016: BUN 21; Creatinine, Ser 0.93; Hemoglobin 15.0; Platelets 227; Potassium 3.9; Sodium 139; TSH 2.860    Lipid Panel    Component Value Date/Time   CHOL 187 09/18/2014 1031   TRIG 116 09/18/2014 1031   HDL 42 (L) 09/18/2014 1031   CHOLHDL 4.5 09/18/2014 1031   VLDL 23 09/18/2014 1031   LDLCALC 122 09/18/2014 1031      Wt Readings from Last 3 Encounters:  01/12/17 221 lb (100.2 kg)  12/14/16 217 lb (98.4 kg)  12/07/16 217 lb 12.8 oz (98.8 kg)      Other studies Reviewed: Additional studies/ records that were reviewed today include:Lexiscan Myoview. Review of the  above records demonstrates:     ASSESSMENT AND PLAN:  CHEST PAIN:  I see no cardiac etiology to this and I have discussed with her other possible etiologies and she will return to her primary care physician for further evaluation of probable nonanginal chest pain.  OBESITY:    She would be at acceptable risk for bariatric surgery.  She is still going through this process.  SLEEP APNEA:   She cannot wear CPAP.      Current medicines are reviewed at length with the patient today.  The patient does not have concerns regarding medicines.  The following changes have been made:   None  Labs/ tests ordered today include:   None  No orders of the defined types were placed in this encounter.    Disposition:   FU with me as needed. Ronnell Guadalajara, MD  01/12/2017 9:38 AM    [place]

## 2017-01-12 ENCOUNTER — Ambulatory Visit (INDEPENDENT_AMBULATORY_CARE_PROVIDER_SITE_OTHER): Payer: Medicare Other | Admitting: Cardiology

## 2017-01-12 ENCOUNTER — Encounter: Payer: Self-pay | Admitting: Cardiology

## 2017-01-12 VITALS — BP 108/68 | HR 72 | Ht 63.0 in | Wt 221.0 lb

## 2017-01-12 DIAGNOSIS — R0602 Shortness of breath: Secondary | ICD-10-CM | POA: Diagnosis not present

## 2017-01-12 DIAGNOSIS — R079 Chest pain, unspecified: Secondary | ICD-10-CM

## 2017-01-12 NOTE — Patient Instructions (Signed)
Medication Instructions:  Continue current medications  If you need a refill on your cardiac medications before your next appointment, please call your pharmacy.  Labwork: None Ordered   Testing/Procedures: None ordered  Follow-Up: Your physician wants you to follow-up in: As Needed.      Thank you for choosing [facility]!!       

## 2017-04-26 ENCOUNTER — Emergency Department (HOSPITAL_COMMUNITY): Payer: Medicare Other

## 2017-04-26 ENCOUNTER — Encounter (HOSPITAL_COMMUNITY): Payer: Self-pay | Admitting: Emergency Medicine

## 2017-04-26 ENCOUNTER — Emergency Department (HOSPITAL_COMMUNITY)
Admission: EM | Admit: 2017-04-26 | Discharge: 2017-04-26 | Disposition: A | Discharge status: Home / Self Care | Payer: Medicare Other | Attending: Emergency Medicine | Admitting: Emergency Medicine

## 2017-04-26 DIAGNOSIS — Z87891 Personal history of nicotine dependence: Secondary | ICD-10-CM | POA: Diagnosis not present

## 2017-04-26 DIAGNOSIS — R51 Headache: Secondary | ICD-10-CM | POA: Diagnosis not present

## 2017-04-26 DIAGNOSIS — R112 Nausea with vomiting, unspecified: Secondary | ICD-10-CM | POA: Insufficient documentation

## 2017-04-26 DIAGNOSIS — R42 Dizziness and giddiness: Secondary | ICD-10-CM | POA: Insufficient documentation

## 2017-04-26 DIAGNOSIS — N10 Acute tubulo-interstitial nephritis: Secondary | ICD-10-CM | POA: Insufficient documentation

## 2017-04-26 DIAGNOSIS — R197 Diarrhea, unspecified: Secondary | ICD-10-CM | POA: Diagnosis not present

## 2017-04-26 DIAGNOSIS — N12 Tubulo-interstitial nephritis, not specified as acute or chronic: Secondary | ICD-10-CM

## 2017-04-26 DIAGNOSIS — R1084 Generalized abdominal pain: Secondary | ICD-10-CM | POA: Diagnosis present

## 2017-04-26 DIAGNOSIS — R05 Cough: Secondary | ICD-10-CM | POA: Insufficient documentation

## 2017-04-26 LAB — URINALYSIS, ROUTINE W REFLEX MICROSCOPIC
Bilirubin Urine: NEGATIVE
GLUCOSE, UA: NEGATIVE mg/dL
KETONES UR: NEGATIVE mg/dL
NITRITE: POSITIVE — AB
PH: 5 (ref 5.0–8.0)
Protein, ur: NEGATIVE mg/dL
Specific Gravity, Urine: 1.019 (ref 1.005–1.030)

## 2017-04-26 LAB — COMPREHENSIVE METABOLIC PANEL
ALK PHOS: 112 U/L (ref 38–126)
ALT: 37 U/L (ref 14–54)
AST: 40 U/L (ref 15–41)
Albumin: 4.4 g/dL (ref 3.5–5.0)
Anion gap: 14 (ref 5–15)
BILIRUBIN TOTAL: 0.6 mg/dL (ref 0.3–1.2)
BUN: 27 mg/dL — ABNORMAL HIGH (ref 6–20)
CALCIUM: 9.3 mg/dL (ref 8.9–10.3)
CO2: 22 mmol/L (ref 22–32)
Chloride: 100 mmol/L — ABNORMAL LOW (ref 101–111)
Creatinine, Ser: 1.04 mg/dL — ABNORMAL HIGH (ref 0.44–1.00)
GFR, EST NON AFRICAN AMERICAN: 56 mL/min — AB (ref >60)
Glucose, Bld: 130 mg/dL — ABNORMAL HIGH (ref 65–99)
POTASSIUM: 3.8 mmol/L (ref 3.5–5.1)
Sodium: 136 mmol/L (ref 135–145)
TOTAL PROTEIN: 8.3 g/dL — AB (ref 6.5–8.1)

## 2017-04-26 LAB — LIPASE, BLOOD: Lipase: 23 U/L (ref 11–51)

## 2017-04-26 LAB — CBC
HEMATOCRIT: 47 % — AB (ref 36.0–46.0)
Hemoglobin: 16.7 g/dL — ABNORMAL HIGH (ref 12.0–15.0)
MCH: 30.1 pg (ref 26.0–34.0)
MCHC: 35.5 g/dL (ref 30.0–36.0)
MCV: 84.8 fL (ref 78.0–100.0)
Platelets: 243 10*3/uL (ref 150–400)
RBC: 5.54 MIL/uL — AB (ref 3.87–5.11)
RDW: 12.9 % (ref 11.5–15.5)
WBC: 11.8 10*3/uL — AB (ref 4.0–10.5)

## 2017-04-26 MED ORDER — SULFAMETHOXAZOLE-TRIMETHOPRIM 800-160 MG PO TABS: 1.0000 | ORAL_TABLET | Freq: Two times a day (BID) | ORAL | 0 refills | Status: AC

## 2017-04-26 MED ORDER — METOCLOPRAMIDE HCL 5 MG/ML IJ SOLN
10.0000 mg | Freq: Once | INTRAMUSCULAR | Status: AC
  Administered 2017-04-26: 10 mg via INTRAVENOUS
  Filled 2017-04-26: qty 2

## 2017-04-26 MED ORDER — HYDROCHLOROTHIAZIDE 12.5 MG PO CAPS
25.0000 mg | ORAL_CAPSULE | Freq: Once | ORAL | Status: DC
  Filled 2017-04-26: qty 2

## 2017-04-26 MED ORDER — METOCLOPRAMIDE HCL 10 MG PO TABS: 10.0000 mg | ORAL_TABLET | Freq: Four times a day (QID) | ORAL | 0 refills | Status: DC

## 2017-04-26 MED ORDER — SODIUM CHLORIDE 0.9 % IV BOLUS (SEPSIS)
1000.0000 mL | Freq: Once | INTRAVENOUS | Status: AC
  Administered 2017-04-26: 1000 mL via INTRAVENOUS

## 2017-04-26 MED ORDER — SODIUM CHLORIDE 0.9 % IV SOLN
1.0000 g | Freq: Once | INTRAVENOUS | Status: AC
  Administered 2017-04-26: 1 g via INTRAVENOUS
  Filled 2017-04-26: qty 10

## 2017-04-26 NOTE — Discharge Instructions (Signed)
Take 1 tablet of Bactrim twice daily for the next 7 days.  1 tablet of Reglan every 6 hours as needed for nausea and vomiting.    Take 650 mg of Tylenol every 6 hours as needed for pain.  Please call and schedule a follow-up appointment with your primary care provider for reevaluation in the next 3-4 days.  If you develop new or worsening symptoms including vomiting despite taking Reglan, uncontrollable abdominal pain, a high fever that does not improve with Tylenol, or other new concerning symptoms, please return to the emergency department for reevaluation.

## 2017-04-26 NOTE — ED Notes (Signed)
Patient tolerated crackers and coke.

## 2017-04-26 NOTE — ED Triage Notes (Signed)
Pt c/o n/v that started around midnight. Diarrhea started just little bit ago. Patient reports abd pains.

## 2017-04-26 NOTE — ED Provider Notes (Signed)
[facility] DEPT Provider Note   CSN: [id number] Arrival date & time: 04/26/17  0915     History   Chief Complaint Chief Complaint  Patient presents with  . Vomiting  . Diarrhea  . Abdominal Pain    HPI Marisa Ryan is a 62 y.o. female with a history of GERD, HTN, and obesity who presents to the emergency department with a chief complaint of emesis with associated nausea, diarrhea, abdominal pain, headache, dizziness, xerostomia, and feeling offbalance.  The patient endorses 17 episodes of green emesis that began at midnight with 3 episodes of diarrhea since 5 AM.  Last episode of emesis was at 10 AM.  She also endorses constant, severe, cramping, diffuse abdominal pain.  No known aggravating or alleviating factors.  She also endorses intermittent dizziness that is worse with standing and a constant, throbbing headache since this AM.  She denies fever, chills, vaginal pain, bleeding, or discharge, chest pain, dyspnea, tinnitus, visual disturbances, weakness, numbness, melena, hematochezia, or hematemesis. Last normal BM was yesterday.   She also has a history of chronic dysuria and was seen by her PCP last week to get checked for a UTI and is supposed to follow-up later this week for the results.  She also states that she had an episode of lightheadedness and dizziness last week that lasted for several hours.  She called EMS who evaluated her at her home, but did not come to the ED for further workup.  She reports that she has felt "off" since last week and had had a nonproductive cough over the last few weeks.  Sick contacts include her grandchildren have been ill with similar symptoms over the last few weeks.   Surgical history includes left nephrectomy, hysterectomy, C-section x2, and cholecystectomy.  She has not taken any of her AM home medications due to her symptoms.   The history is provided by the patient. No language interpreter was used.     Past Medical History:  Diagnosis Date  . Abdominal pain   . Chest pain 09/12/2014  . GERD (gastroesophageal reflux disease)   . Hernia   . Hypertension    Patient only takes medication when needed.  . Morbid obesity ([place])   . Nausea   . Palpitations   . Renal disorder    left nephrectomy  . UTI (urinary tract infection)    history of them    Patient Active Problem List   Diagnosis Date Noted  . Former smoker 12/07/2016  . Family history of coronary artery disease in mother 12/07/2016  . Contrast media allergy 12/07/2016  . Chest pain with moderate risk for cardiac etiology 09/12/2014  . Palpitations   . Morbid obesity ([place])   . Postoperative wound infection 11/08/2010  . DYSPNEA 10/15/2009  . Essential hypertension 10/14/2009    Past Surgical History:  Procedure Laterality Date  . ABDOMINAL HYSTERECTOMY  01/2003  . BLADDER SUSPENSION  11/2002  . BREATH TEK H PYLORI N/A 07/30/2014   Procedure: BREATH TEK H PYLORI;  Surgeon: Excell Seltzer, MD;  Location: [facility];  Service: General;  Laterality: N/A;  . CESAREAN SECTION    . COLONOSCOPY N/A 07/31/2015   Procedure: COLONOSCOPY;  Surgeon: Carol Ada, MD;  Location: [facility];  Service: Endoscopy;  Laterality: N/A;  . GLAUCOMA SURGERY    . HERNIA REPAIR    . LAPAROSCOPIC CHOLECYSTECTOMY  11/01/10   Dr Lilyan Punt  . removal of kidney  2008   left  . UMBILICAL  HERNIA REPAIR  11/01/10   Dr Lilyan Punt, no mesh    OB History    No data available       Home Medications    Prior to Admission medications   Medication Sig Start Date End Date Taking? Authorizing Provider  hydrochlorothiazide (HYDRODIURIL) 25 MG tablet Take 25 mg by mouth daily. 11/13/12  Yes [provider]  omeprazole (PRILOSEC) 40 MG capsule Take 40 mg by mouth daily. 07/28/10  Yes [provider]  traZODone (DESYREL) 50 MG tablet Take 1 tablet by mouth at bedtime as needed. 12/13/16  Yes [provider]  metoCLOPramide  (REGLAN) 10 MG tablet Take 1 tablet (10 mg total) by mouth every 6 (six) hours. 04/26/17   Kayline Sheer A, PA-C  sulfamethoxazole-trimethoprim (BACTRIM DS,SEPTRA DS) 800-160 MG tablet Take 1 tablet by mouth 2 (two) times daily for 7 days. 04/26/17 05/03/17  Mishti Swanton, Laymond Purser, PA-C    Family History Family History  Problem Relation Age of Onset  . Cancer Mother        breast  . Heart disease Mother 70       [place]  . Stroke Mother   . Heart attack Father 82  . Hypertension Brother   . Stroke Maternal Aunt   . Stroke Maternal Grandmother     Social History Social History   Tobacco Use  . Smoking status: Former Smoker    Last attempt to quit: 05/24/1991    Years since quitting: 25.9  . Smokeless tobacco: Never Used  Substance Use Topics  . Alcohol use: No  . Drug use: No     Allergies   Cortisone; Ivp dye [iodinated diagnostic agents]; and Zofran   Review of Systems Review of Systems  Constitutional: Negative for activity change, chills and fever.  HENT: Negative for sore throat.   Eyes: Negative for visual disturbance.  Respiratory: Positive for cough. Negative for shortness of breath.   Cardiovascular: Negative for chest pain.  Gastrointestinal: Positive for diarrhea, nausea and vomiting. Negative for abdominal pain, anal bleeding, blood in stool and constipation.  Genitourinary: Positive for dysuria. Negative for hematuria, menstrual problem, vaginal bleeding, vaginal discharge and vaginal pain.  Musculoskeletal: Negative for back pain, neck pain and neck stiffness.  Skin: Negative for rash.  Allergic/Immunologic: Negative for immunocompromised state.  Neurological: Positive for dizziness and headaches. Negative for syncope, weakness and numbness.  Psychiatric/Behavioral: Negative for confusion.     Physical Exam Updated Vital Signs BP 110/64   Pulse 78   Temp 98.3 F (36.8 C) (Oral)   Resp 17   SpO2 99%   Physical Exam  Constitutional: No distress.    HENT:  Head: Normocephalic.  Tongue and mucous membranes appear dry.  Eyes: Conjunctivae are normal.  Neck: Neck supple.  Cardiovascular: Normal rate, regular rhythm, normal heart sounds and intact distal pulses. Exam reveals no gallop and no friction rub.  No murmur heard. Pulmonary/Chest: Effort normal. No stridor. No respiratory distress. She has no wheezes. She has no rales. She exhibits no tenderness.  Abdominal: Soft. Bowel sounds are normal. She exhibits no distension and no mass. There is tenderness. There is no rebound and no guarding.  Tender to palpation in the bilateral upper quadrants and right lower quadrant and suprapubic area without rebound or guarding.  Left lower quadrant is nontender to palpation.  Obese abdomen.  Abdomen is soft, nondistended.  Right CVA tenderness.  No peritoneal signs.  Neurological: She is alert.  Skin: Skin is warm. Capillary  refill takes 2 to 3 seconds. No rash noted. She is not diaphoretic.  Psychiatric: Her behavior is normal.  Nursing note and vitals reviewed.  ED Treatments / Results  Labs (all labs ordered are listed, but only abnormal results are displayed) Labs Reviewed  COMPREHENSIVE METABOLIC PANEL - Abnormal; Notable for the following components:      Result Value   Chloride 100 (*)    Glucose, Bld 130 (*)    BUN 27 (*)    Creatinine, Ser 1.04 (*)    Total Protein 8.3 (*)    GFR calc non Af Amer 56 (*)    All other components within normal limits  CBC - Abnormal; Notable for the following components:   WBC 11.8 (*)    RBC 5.54 (*)    Hemoglobin 16.7 (*)    HCT 47.0 (*)    All other components within normal limits  URINALYSIS, ROUTINE W REFLEX MICROSCOPIC - Abnormal; Notable for the following components:   APPearance HAZY (*)    Hgb urine dipstick MODERATE (*)    Nitrite POSITIVE (*)    Leukocytes, UA TRACE (*)    Bacteria, UA MANY (*)    Squamous Epithelial / LPF 6-30 (*)    All other components within normal limits   URINE CULTURE  LIPASE, BLOOD    EKG  EKG Interpretation None       Radiology Ct Renal Stone Study  Result Date: 04/26/2017 CLINICAL DATA:  Hematuria nausea and vomiting EXAM: CT ABDOMEN AND PELVIS WITHOUT CONTRAST TECHNIQUE: Multidetector CT imaging of the abdomen and pelvis was performed following the standard protocol without IV contrast. COMPARISON:  05/15/2015 FINDINGS: Lower chest: Lung bases are free of acute infiltrate or sizable effusion. Hepatobiliary: Stable calcification is noted within the left lobe of the liver. The gallbladder has been surgically removed. No hepatic abnormality is noted. Pancreas: Unremarkable. No pancreatic ductal dilatation or surrounding inflammatory changes. Spleen: Normal in size without focal abnormality. Adrenals/Urinary Tract: The left kidney has been surgically removed. The right kidney and right adrenal gland are within normal limits. The bladder is partially distended. Stomach/Bowel: Scattered diverticular changes noted without evidence of diverticulitis. The appendix is within normal limits. No obstructive or inflammatory changes of the bowel are seen. Vascular/Lymphatic: Aortic atherosclerosis. No enlarged abdominal or pelvic lymph nodes. Reproductive: Status post hysterectomy. No adnexal masses. Other: Multiple anterior wall fat containing hernias are noted stable from the prior exam. No ascites is seen. Musculoskeletal: No acute bony abnormality is noted. Postsurgical changes in lumbar spine are noted at L4-L5. IMPRESSION: Stable anterior fat containing hernias. Postsurgical changes as described. Diverticulosis without diverticulitis. No acute abnormality is noted. Electronically Signed   By: Inez Catalina M.D.   On: 04/26/2017 15:17    Procedures Procedures (including critical care time)  Medications Ordered in ED Medications  metoCLOPramide (REGLAN) injection 10 mg (10 mg Intravenous Given 04/26/17 1343)  sodium chloride 0.9 % bolus 1,000 mL (0  mLs Intravenous Stopped 04/26/17 1700)  cefTRIAXone (ROCEPHIN) 1 g in sodium chloride 0.9 % 100 mL IVPB (0 g Intravenous Stopped 04/26/17 1626)     Initial Impression / Assessment and Plan / ED Course  I have reviewed the triage vital signs and the nursing notes.  Pertinent labs & imaging results that were available during my care of the patient were reviewed by me and considered in my medical decision making (see chart for details).     62 year old female left nephrectomy, GERD, HTN, and obesity presenting with  nausea, vomiting, diarrhea, and abdominal pain. Patient is nontoxic, nonseptic appearing, in no apparent distress.  Patient's pain and other symptoms adequately managed in emergency department.  Patient was seen and evaluated by Dr. Jeneen Rinks, attending physician.   Labs, imaging and vitals reviewed.  Patient does not meet the SIRS or Sepsis criteria.  UA with hemoglobinuria, leukocytes, many bacteria.  Urine culture sent.  She also has right CVA tenderness on exam.  CBC appears hemoconcentrated secondary to dehydration, which is consistent with the patient's physical exam.  Fluid bolus given.  IV ceftriaxone given in the ED.  On repeat exam patient does not have a surgical abdomen and there are no peritoneal signs.  No indication of appendicitis, bowel obstruction, bowel perforation, cholecystitis, diverticulitis.  Patient discharged home with symptomatic treatment, Bactrim, and given strict instructions for follow-up with their primary care physician.  I have also discussed reasons to return immediately to the ER.  Patient expresses understanding and agrees with plan.  Final Clinical Impressions(s) / ED Diagnoses   Final diagnoses:  Pyelonephritis  Nausea vomiting and diarrhea    ED Discharge Orders        Ordered    metoCLOPramide (REGLAN) 10 MG tablet  Every 6 hours     04/26/17 1706    sulfamethoxazole-trimethoprim (BACTRIM DS,SEPTRA DS) 800-160 MG tablet  2 times daily      04/26/17 1706       Mayo Faulk A, PA-C 04/26/17 1713    Tanna Furry, MD 04/27/17 1520

## 2017-04-29 LAB — URINE CULTURE

## 2017-04-30 ENCOUNTER — Telehealth: Payer: Self-pay

## 2017-04-30 NOTE — Telephone Encounter (Signed)
Post ED Visit - Positive Culture Follow-up  Culture report reviewed by antimicrobial stewardship pharmacist:  []  Elenor Quinones, Pharm.D. []  Heide Guile, Pharm.D., BCPS AQ-ID [x]  Parks Neptune, Pharm.D., BCPS []  Alycia Rossetti, Pharm.D., BCPS []  Brandon, Pharm.D., BCPS, AAHIVP []  Legrand Como, Pharm.D., BCPS, AAHIVP []  Salome Arnt, PharmD, BCPS []  Jalene Mullet, PharmD []  Vincenza Hews, PharmD, BCPS  Positive urine culture Treated with Sulfamethoxazole-Trimethoprim, organism sensitive to the same and no further patient follow-up is required at this time.  Genia Del 04/30/2017, 10:03 AM

## 2017-10-31 IMAGING — CR DG KNEE COMPLETE 4+V*L*
4 series · 4 of 4 positions shown · non-contrast
Comparison: None.

CLINICAL DATA: Left knee pain.  No trauma.

EXAM:
LEFT KNEE - COMPLETE 4+ VIEW

[knee ap]
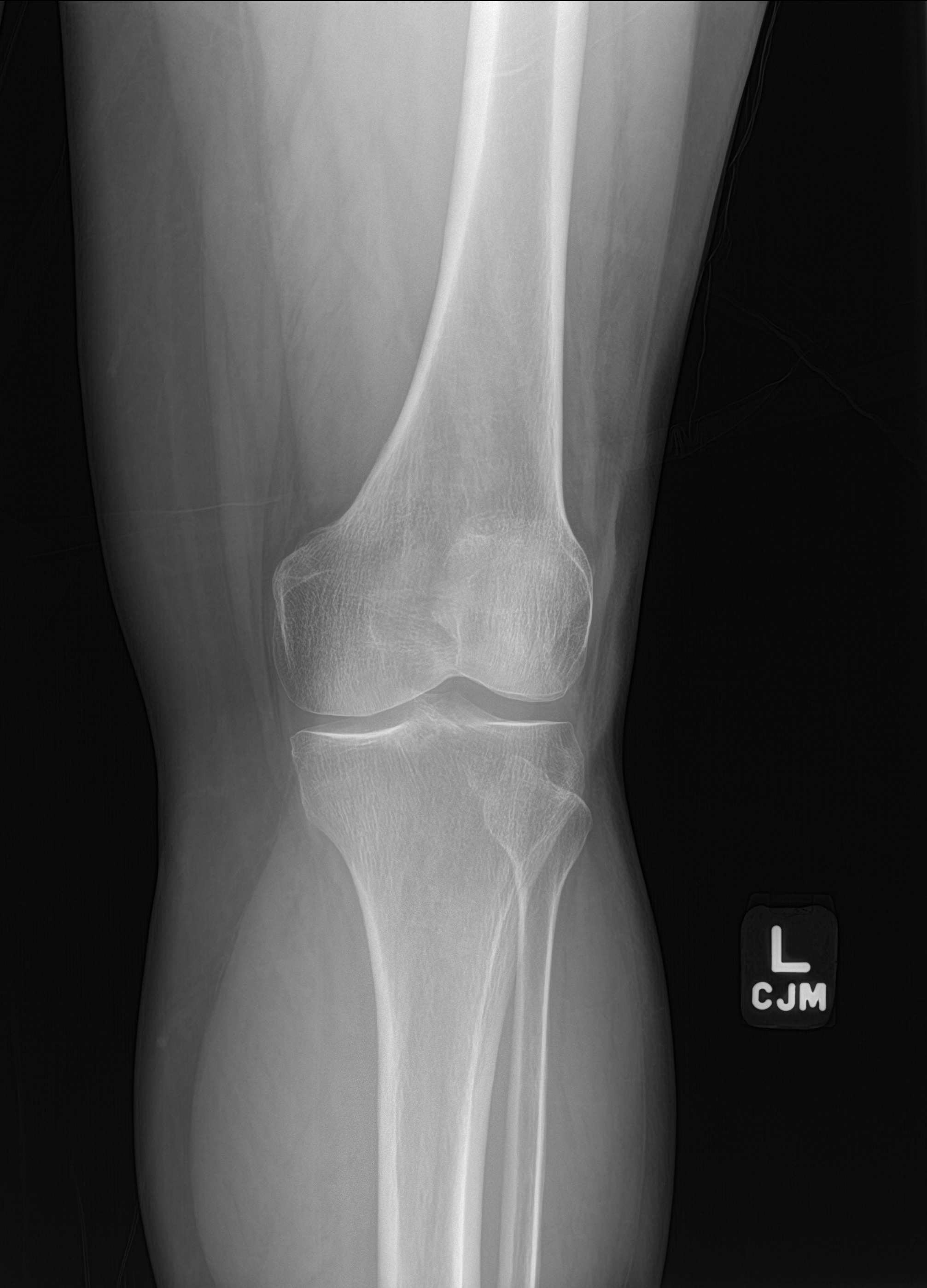

[knee lat]
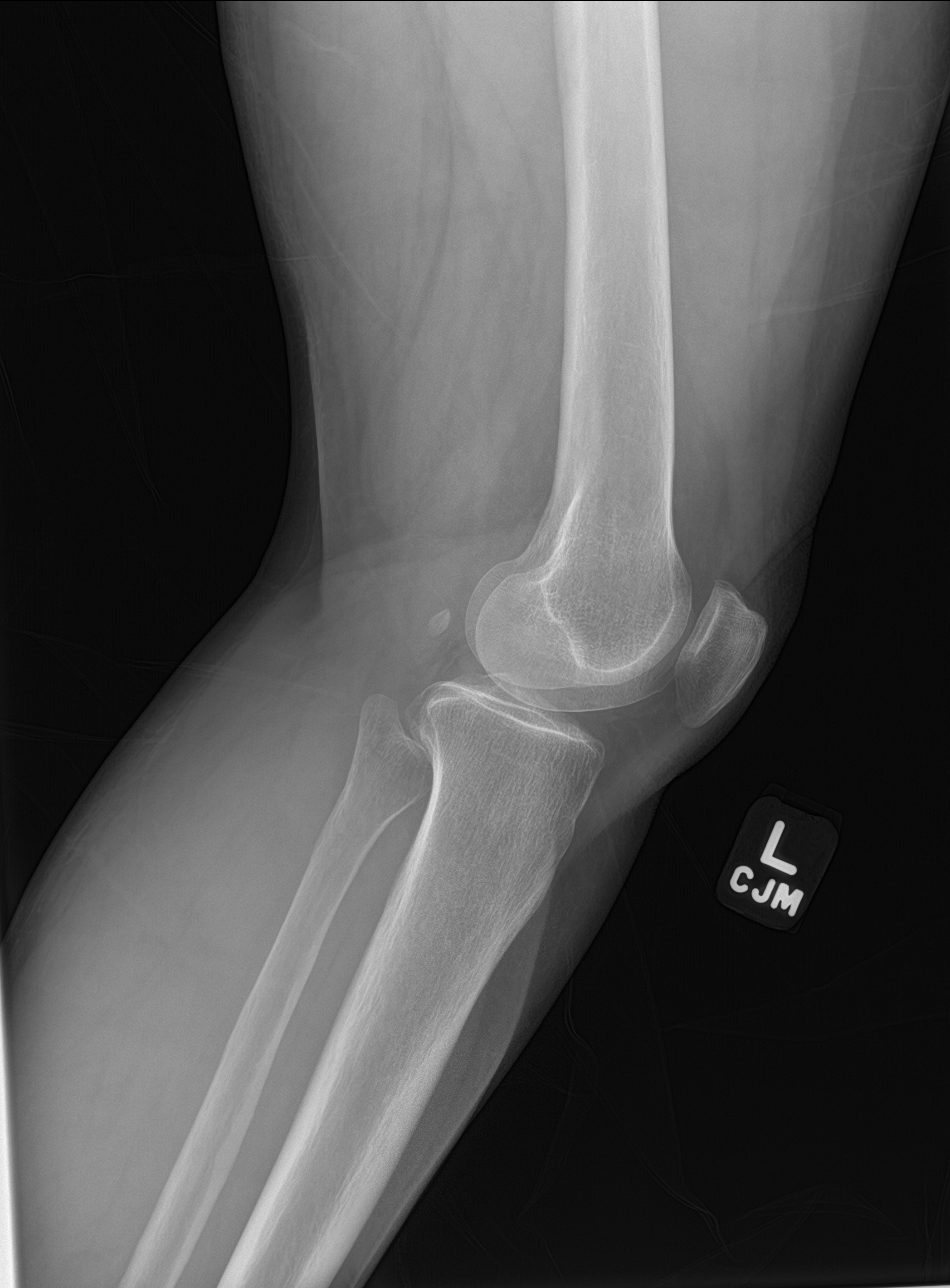

[knee obl (1 of 2)]
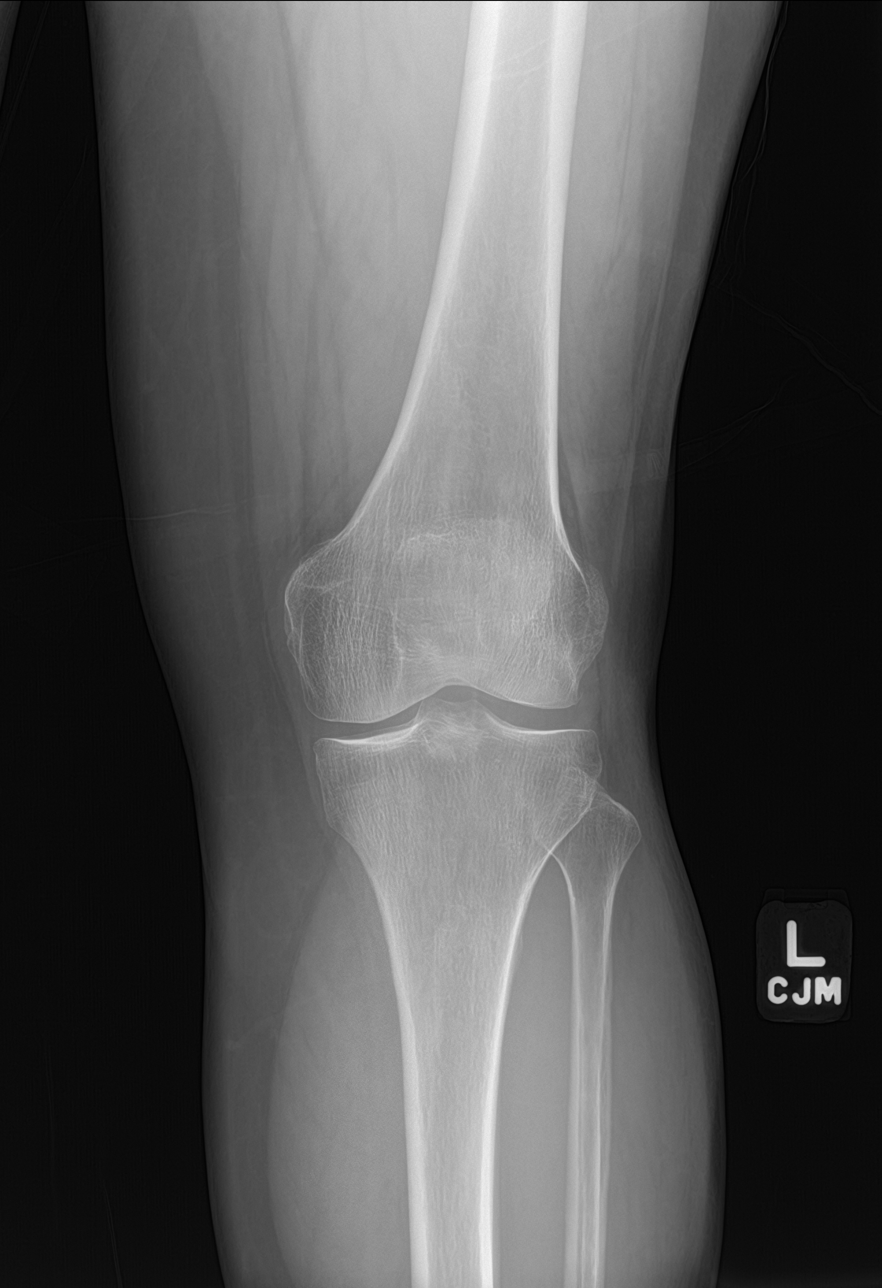

[knee obl (2 of 2)]
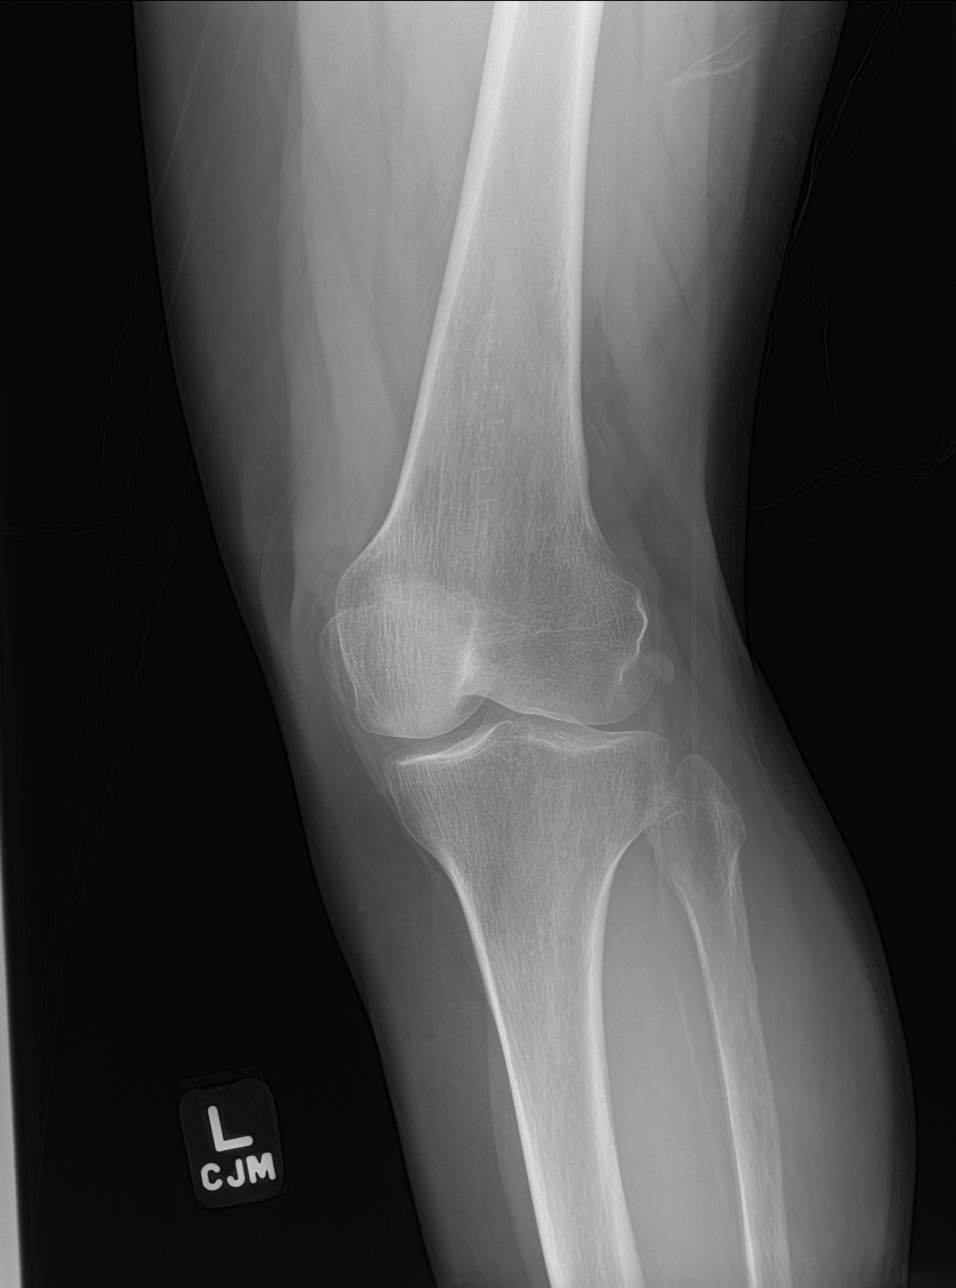

[4 of 4 positions shown; findings below may reference images not displayed]

FINDINGS: The joint spaces are maintained. No acute fracture or osteochondral
abnormality. No joint effusion. No chondrocalcinosis.
IMPRESSION: No acute bony findings, significant degenerative changes or joint
effusion.

## 2018-08-20 ENCOUNTER — Other Ambulatory Visit: Payer: Self-pay | Admitting: Family Medicine

## 2018-08-20 DIAGNOSIS — E2839 Other primary ovarian failure: Secondary | ICD-10-CM

## 2018-08-20 DIAGNOSIS — Z1231 Encounter for screening mammogram for malignant neoplasm of breast: Secondary | ICD-10-CM

## 2018-10-30 ENCOUNTER — Ambulatory Visit: Payer: Medicare Other

## 2018-10-30 ENCOUNTER — Ambulatory Visit
Admission: RE | Admit: 2018-10-30 | Discharge: 2018-10-30 | Disposition: A | Discharge status: Home / Self Care | Payer: Medicare Other | Source: Ambulatory Visit | Attending: Family Medicine | Admitting: Family Medicine

## 2018-10-30 ENCOUNTER — Other Ambulatory Visit: Payer: Self-pay

## 2018-10-30 DIAGNOSIS — Z1231 Encounter for screening mammogram for malignant neoplasm of breast: Secondary | ICD-10-CM

## 2019-01-14 ENCOUNTER — Emergency Department (HOSPITAL_COMMUNITY)
Admission: EM | Admit: 2019-01-14 | Discharge: 2019-01-14 | Disposition: A | Discharge status: Home / Self Care | Payer: Medicare Other | Attending: Emergency Medicine | Admitting: Emergency Medicine

## 2019-01-14 ENCOUNTER — Emergency Department (HOSPITAL_COMMUNITY)
Admission: EM | Admit: 2019-01-14 | Discharge: 2019-01-14 | Disposition: A | Discharge status: Home / Self Care | Payer: Medicare Other | Source: Home / Self Care | Attending: Emergency Medicine | Admitting: Emergency Medicine

## 2019-01-14 ENCOUNTER — Encounter (HOSPITAL_COMMUNITY): Payer: Self-pay

## 2019-01-14 ENCOUNTER — Other Ambulatory Visit: Payer: Self-pay

## 2019-01-14 ENCOUNTER — Emergency Department (HOSPITAL_COMMUNITY): Payer: Medicare Other

## 2019-01-14 ENCOUNTER — Encounter (HOSPITAL_COMMUNITY): Payer: Self-pay | Admitting: Emergency Medicine

## 2019-01-14 DIAGNOSIS — Z5321 Procedure and treatment not carried out due to patient leaving prior to being seen by health care provider: Secondary | ICD-10-CM | POA: Diagnosis not present

## 2019-01-14 DIAGNOSIS — R197 Diarrhea, unspecified: Secondary | ICD-10-CM

## 2019-01-14 DIAGNOSIS — K529 Noninfective gastroenteritis and colitis, unspecified: Secondary | ICD-10-CM

## 2019-01-14 DIAGNOSIS — Z87891 Personal history of nicotine dependence: Secondary | ICD-10-CM | POA: Diagnosis not present

## 2019-01-14 DIAGNOSIS — I10 Essential (primary) hypertension: Secondary | ICD-10-CM | POA: Diagnosis not present

## 2019-01-14 DIAGNOSIS — Z79899 Other long term (current) drug therapy: Secondary | ICD-10-CM | POA: Diagnosis not present

## 2019-01-14 LAB — URINALYSIS, ROUTINE W REFLEX MICROSCOPIC
Bilirubin Urine: NEGATIVE
Glucose, UA: NEGATIVE mg/dL
Ketones, ur: NEGATIVE mg/dL
Nitrite: NEGATIVE
Protein, ur: NEGATIVE mg/dL
Specific Gravity, Urine: 1.018 (ref 1.005–1.030)
pH: 5 (ref 5.0–8.0)

## 2019-01-14 LAB — COMPREHENSIVE METABOLIC PANEL
ALT: 52 U/L — ABNORMAL HIGH (ref 0–44)
AST: 49 U/L — ABNORMAL HIGH (ref 15–41)
Albumin: 4.1 g/dL (ref 3.5–5.0)
Alkaline Phosphatase: 89 U/L (ref 38–126)
Anion gap: 9 (ref 5–15)
BUN: 19 mg/dL (ref 8–23)
CO2: 23 mmol/L (ref 22–32)
Calcium: 9.5 mg/dL (ref 8.9–10.3)
Chloride: 107 mmol/L (ref 98–111)
Creatinine, Ser: 1.01 mg/dL — ABNORMAL HIGH (ref 0.44–1.00)
GFR calc Af Amer: >60 mL/min (ref >60)
GFR calc non Af Amer: 59 mL/min — ABNORMAL LOW (ref >60)
Glucose, Bld: 88 mg/dL (ref 70–99)
Potassium: 4.1 mmol/L (ref 3.5–5.1)
Sodium: 139 mmol/L (ref 135–145)
Total Bilirubin: 0.7 mg/dL (ref 0.3–1.2)
Total Protein: 7.3 g/dL (ref 6.5–8.1)

## 2019-01-14 LAB — CBC
HCT: 48.6 % — ABNORMAL HIGH (ref 36.0–46.0)
Hemoglobin: 16.2 g/dL — ABNORMAL HIGH (ref 12.0–15.0)
MCH: 30.1 pg (ref 26.0–34.0)
MCHC: 33.3 g/dL (ref 30.0–36.0)
MCV: 90.3 fL (ref 80.0–100.0)
Platelets: 206 10*3/uL (ref 150–400)
RBC: 5.38 MIL/uL — ABNORMAL HIGH (ref 3.87–5.11)
RDW: 12.4 % (ref 11.5–15.5)
WBC: 11.2 10*3/uL — ABNORMAL HIGH (ref 4.0–10.5)
nRBC: 0 % (ref 0.0–0.2)

## 2019-01-14 LAB — LIPASE, BLOOD: Lipase: 23 U/L (ref 11–51)

## 2019-01-14 MED ORDER — SODIUM CHLORIDE 0.9 % IV BOLUS
1000.0000 mL | Freq: Once | INTRAVENOUS | Status: AC
  Administered 2019-01-14: 21:00:00 1000 mL via INTRAVENOUS

## 2019-01-14 MED ORDER — CIPROFLOXACIN HCL 500 MG PO TABS: 500.0000 mg | ORAL_TABLET | Freq: Two times a day (BID) | ORAL | 0 refills | Status: DC

## 2019-01-14 MED ORDER — METOCLOPRAMIDE HCL 10 MG PO TABS: 10.0000 mg | ORAL_TABLET | Freq: Four times a day (QID) | ORAL | 0 refills | Status: DC

## 2019-01-14 MED ORDER — METRONIDAZOLE 500 MG PO TABS: 500.0000 mg | ORAL_TABLET | Freq: Two times a day (BID) | ORAL | 0 refills | Status: DC

## 2019-01-14 MED ORDER — SODIUM CHLORIDE 0.9% FLUSH: 3.0000 mL | Freq: Once | INTRAVENOUS | Status: DC

## 2019-01-14 MED ORDER — METOCLOPRAMIDE HCL 5 MG/ML IJ SOLN
5.0000 mg | Freq: Once | INTRAMUSCULAR | Status: AC
  Administered 2019-01-14: 21:00:00 5 mg via INTRAVENOUS
  Filled 2019-01-14: qty 2

## 2019-01-14 NOTE — ED Notes (Signed)
Pt leaving, states her husband called [facility] and they have no wait.

## 2019-01-14 NOTE — ED Provider Notes (Signed)
[place] DEPT Provider Note   CSN: [id number] Arrival date & time: 01/14/19  1912     History   Chief Complaint Chief Complaint  Patient presents with  . Abdominal Pain  . Diarrhea    HPI Marisa Ryan is a 63 y.o. female.     63 year old female with prior medical history as detailed below presents for evaluation of reported diarrhea.  Patient reports diarrhea ongoing for the last 2 weeks.  She reports that she was diagnosed with H. pylori infection approximately 2 weeks ago.  She completed a course of antibiotics for same.  Just prior to her diagnosis she reports that her diarrhea began.  She describes loose watery stools on a daily basis.  Over the last 2 days she is reporting increased episodes of diarrhea.  She denies vomiting.  She denies fever.  She denies abdominal pain.  Of note, patient was seen initially over at [facility] earlier this evening.  She left after initial triage and came to [facility] facility secondary to the lengthy wait.  The history is provided by the patient and medical records.  Diarrhea Quality:  Watery Severity:  Mild Onset quality:  Gradual Duration:  2 weeks Timing:  Constant Progression:  Worsening Relieved by:  Nothing Worsened by:  Nothing Associated symptoms: no abdominal pain and no fever     Past Medical History:  Diagnosis Date  . Abdominal pain   . Chest pain 09/12/2014  . GERD (gastroesophageal reflux disease)   . Hernia   . Hypertension    Patient only takes medication when needed.  . Morbid obesity ([place])   . Nausea   . Palpitations   . Renal disorder    left nephrectomy  . UTI (urinary tract infection)    history of them    Patient Active Problem List   Diagnosis Date Noted  . Former smoker 12/07/2016  . Family history of coronary artery disease in mother 12/07/2016  . Contrast media allergy 12/07/2016  . Chest pain with moderate risk for cardiac etiology 09/12/2014  .  Palpitations   . Morbid obesity ([place])   . Postoperative wound infection 11/08/2010  . DYSPNEA 10/15/2009  . Essential hypertension 10/14/2009    Past Surgical History:  Procedure Laterality Date  . ABDOMINAL HYSTERECTOMY  01/2003  . BLADDER SUSPENSION  11/2002  . BREATH TEK H PYLORI N/A 07/30/2014   Procedure: BREATH TEK H PYLORI;  Surgeon: Excell Seltzer, MD;  Location: [facility];  Service: General;  Laterality: N/A;  . CESAREAN SECTION    . COLONOSCOPY N/A 07/31/2015   Procedure: COLONOSCOPY;  Surgeon: Carol Ada, MD;  Location: [facility];  Service: Endoscopy;  Laterality: N/A;  . GLAUCOMA SURGERY    . HERNIA REPAIR    . LAPAROSCOPIC CHOLECYSTECTOMY  11/01/10   Dr Lilyan Punt  . removal of kidney  2008   left  . UMBILICAL HERNIA REPAIR  11/01/10   Dr Lilyan Punt, no mesh     OB History   No obstetric history on file.      Home Medications    Prior to Admission medications   Medication Sig Start Date End Date Taking? Authorizing Provider  hydrochlorothiazide (HYDRODIURIL) 25 MG tablet Take 25 mg by mouth daily. 11/13/12  Yes [provider]  loperamide (IMODIUM A-D) 2 MG tablet Take 2 mg by mouth 4 (four) times daily as needed for diarrhea or loose stools.   Yes [provider]  omeprazole (PRILOSEC) 40 MG  capsule Take 40 mg by mouth every evening.  07/28/10  Yes [provider]  oxybutynin (DITROPAN-XL) 10 MG 24 hr tablet Take 10 mg by mouth at bedtime.  08/22/18  Yes [provider]  ciprofloxacin (CIPRO) 500 MG tablet Take 1 tablet (500 mg total) by mouth 2 (two) times daily. 01/14/19   Valarie Merino, MD  metoCLOPramide (REGLAN) 10 MG tablet Take 1 tablet (10 mg total) by mouth every 6 (six) hours. Patient not taking: Reported on 01/14/2019 04/26/17   McDonald, Maree Erie A, PA-C  metoCLOPramide (REGLAN) 10 MG tablet Take 1 tablet (10 mg total) by mouth every 6 (six) hours. 01/14/19   Valarie Merino, MD  metroNIDAZOLE (FLAGYL) 500 MG tablet Take  1 tablet (500 mg total) by mouth 2 (two) times daily. 01/14/19   Valarie Merino, MD    Family History Family History  Problem Relation Age of Onset  . Cancer Mother        breast  . Heart disease Mother 23       [place]  . Stroke Mother   . Breast cancer Mother 4  . Heart attack Father 94  . Hypertension Brother   . Stroke Maternal Aunt   . Stroke Maternal Grandmother     Social History Social History   Tobacco Use  . Smoking status: Former Smoker    Quit date: 05/24/1991    Years since quitting: 27.6  . Smokeless tobacco: Never Used  Substance Use Topics  . Alcohol use: No  . Drug use: No     Allergies   Cortisone, Ivp dye [iodinated diagnostic agents], and Zofran   Review of Systems Review of Systems  Constitutional: Negative for fever.  Gastrointestinal: Positive for diarrhea. Negative for abdominal pain.  All other systems reviewed and are negative.    Physical Exam Updated Vital Signs BP 128/74   Pulse 69   Temp 98.2 F (36.8 C) (Oral)   Resp 18   Ht 5\' 3"  (1.6 m)   Wt 93 kg   SpO2 96%   BMI 36.31 kg/m   Physical Exam Vitals signs and nursing note reviewed.  Constitutional:      General: She is not in acute distress.    Appearance: She is well-developed.  HENT:     Head: Normocephalic and atraumatic.  Eyes:     Conjunctiva/sclera: Conjunctivae normal.     Pupils: Pupils are equal, round, and reactive to light.  Neck:     Musculoskeletal: Normal range of motion and neck supple.  Cardiovascular:     Rate and Rhythm: Normal rate and regular rhythm.     Heart sounds: Normal heart sounds.  Pulmonary:     Effort: Pulmonary effort is normal. No respiratory distress.     Breath sounds: Normal breath sounds.  Abdominal:     General: Abdomen is flat. There is no distension.     Palpations: Abdomen is soft.     Tenderness: There is no abdominal tenderness.  Musculoskeletal: Normal range of motion.        General: No deformity.  Skin:     General: Skin is warm and dry.  Neurological:     Mental Status: She is alert and oriented to person, place, and time.      ED Treatments / Results  Labs (all labs ordered are listed, but only abnormal results are displayed) Labs Reviewed  URINALYSIS, ROUTINE W REFLEX MICROSCOPIC - Abnormal; Notable for the following components:  Result Value   APPearance HAZY (*)    Hgb urine dipstick SMALL (*)    Leukocytes,Ua MODERATE (*)    Bacteria, UA MANY (*)    All other components within normal limits    EKG None  Radiology Ct Abdomen Pelvis Wo Contrast  Result Date: 01/14/2019 CLINICAL DATA:  Abdominal pain, gastroenteritis or colitis suspected, 20-25 episodes of diarrhea today with mid abdominal pain. EXAM: CT ABDOMEN AND PELVIS WITHOUT CONTRAST TECHNIQUE: Multidetector CT imaging of the abdomen and pelvis was performed following the standard protocol without IV contrast. COMPARISON:  CT abdomen pelvis 04/26/2017 FINDINGS: Lower chest: Lung bases are clear. Normal heart size. No pericardial effusion. Hepatobiliary: Punctate parenchymal calcification in the liver. Stable from prior and may reflect sequela of prior granulomatous disease. No concerning focal liver abnormality is seen. Patient is post cholecystectomy. Slight prominence of the biliary tree likely related to reservoir effect. No calcified intraductal gallstones. Pancreas: Unremarkable. No pancreatic ductal dilatation or surrounding inflammatory changes. Spleen: Normal in size without focal abnormality. 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 No abnormality in the left renal fossa. Normal right adrenal gland. No concerning right renal lesion, urolithiasis or hydronephrosis. Urinary bladder is unremarkable. Stomach/Bowel: Distal esophagus, stomach and duodenal sweep are unremarkable. No small bowel wall thickening or dilatation. No evidence of obstruction. A normal appendix is visualized. Segmental  thickening and pericolonic inflammation centered upon the distal sigmoid in a region of numerous colonic diverticula. No discernible culprit diverticulum is seen. No extraluminal gas, free fluid, organized collection or abscess. Vascular/Lymphatic: Atherosclerotic plaque within the normal caliber aorta. Additional calcifications in the branch vessels. No suspicious or enlarged lymph nodes in the included lymphatic chains. Reactive lymph nodes present in the low abdomen. Reproductive: Uterus appears surgically absent. No concerning adnexal lesions. Other: Multiple fat containing ventral and umbilical hernias. Overall unchanged from comparison in 2019. No bowel containing hernia. No free fluid, free air, organized collection or abscess in the abdomen or pelvis. Musculoskeletal: Multilevel degenerative changes are present in the imaged portions of the spine. Interbody fusion of L4-5. Multilevel discogenic and facet degenerative changes. Grade 1 anterolisthesis L3 on L4 without pars defect, similar to comparison. IMPRESSION: 1. Circumferential thickening of the distal sigmoid colon in a region of numerous colonic diverticula. Inflammation appears somewhat diffuse without a focal discernible culprit diverticulum. While this could reflect an acute uncomplicated diverticulitis, infectious or inflammatory colitis or segmental colitis associated with diverticulosis (SCAD) could have a similar appearance. 2. Multiple fat containing ventral and umbilical hernias, unchanged from comparison in 2019. 3. 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 4. Aortic Atherosclerosis (ICD10-I70.0). Electronically Signed   By: Lovena Le M.D.   On: 01/14/2019 21:47    Procedures Procedures (including critical care time)  Medications Ordered in ED Medications  sodium chloride 0.9 % bolus 1,000 mL (1,000 mLs Intravenous New Bag/Given 01/14/19 2035)  metoCLOPramide (REGLAN) injection 5 mg (5 mg Intravenous Given 01/14/19  2035)     Initial Impression / Assessment and Plan / ED Course  I have reviewed the triage vital signs and the nursing notes.  Pertinent labs & imaging results that were available during my care of the patient were reviewed by me and considered in my medical decision making (see chart for details).        MDM  Screen complete  JAXEN HOCHSTEIN was evaluated in Emergency Department on 01/14/2019 for the symptoms described in the history of present illness. She was evaluated  in the context of the global COVID-19 pandemic, which necessitated consideration that the patient might be at risk for infection with the SARS-CoV-2 virus that causes COVID-19. Institutional protocols and algorithms that pertain to the evaluation of patients at risk for COVID-19 are in a state of rapid change based on information released by regulatory bodies including the CDC and federal and state organizations. These policies and algorithms were followed during the patient's care in the ED.   Patient is presenting for evaluation of reported multiple, loose bowel movements.  Screening labs obtained earlier today at [facility] ED are without significant abnormality.  CT abdomen pelvis done this evening suggests possible sigmoid colitis without perforation or other significant pathology.  Patient was offered admission for further evaluation and/or treatment.  She declined same.  She desires discharge home.  She is concerned about catching COVID from being in the hospital.  Patient does understand the need for close follow-up.  Strict return precautions given and understood.  Will initiate antibiotic therapy for presumed infectious colitis.  Final Clinical Impressions(s) / ED Diagnoses   Final diagnoses:  Diarrhea, unspecified type  Colitis    ED Discharge Orders         Ordered    metoCLOPramide (REGLAN) 10 MG tablet  Every 6 hours     01/14/19 2230    ciprofloxacin (CIPRO) 500 MG tablet  2 times daily      01/14/19 2230    metroNIDAZOLE (FLAGYL) 500 MG tablet  2 times daily     01/14/19 2230           Valarie Merino, MD 01/14/19 2240

## 2019-01-14 NOTE — ED Triage Notes (Addendum)
Pt reports roughly 20-25 episodes of diarrhea today and mid abdominal pain. Pt reports she was diagnosed with H pylori a few weeks back and has been taking antibiotics. Pt drinking a coke out in the lobby. Pt was at [facility] waiting for 4 hours before coming here and they drew her bloodwork.

## 2019-01-14 NOTE — ED Triage Notes (Signed)
Pt arrives to ED with c/c of diarrhea for over 2 weeks and now has abd bloating and nausea.  Pt reports BMs are pale and has mucus.

## 2019-01-14 NOTE — ED Provider Notes (Cosign Needed)
MSE was initiated and I personally evaluated the patient and placed orders (if any) at  4:32 PM on January 14, 2019.  The patient appears stable so that the remainder of the MSE may be completed by another provider.  Patient presents today for evaluation 2 weeks of diarrhea.  Her primary care doctor referred her here.  She reports that she has had diagnosis with H. pylori and was treated with unknown antibiotics.  She stopped that Saturday after completing her course and since then she has had worsening diarrhea.  She denies any fevers.  She reports nausea without vomiting.  Abdominal pain is primarily epigastric and feels like a pressure and improved after she has a bowel movement.  Labs ordered.  Stool studies ordered.    Patient is aware that her care has been started today and the need to stay for additional evaluation.  We discussed the risks of failing to do so and she states her understanding.    Lorin Glass, [place] 01/14/19 1635

## 2019-01-14 NOTE — Discharge Instructions (Signed)
Please return for any problem.  Follow-up with your regular care provider as instructed. °

## 2019-09-16 ENCOUNTER — Emergency Department (HOSPITAL_COMMUNITY)
Admission: EM | Admit: 2019-09-16 | Discharge: 2019-09-16 | Discharge status: Against Medical Advice | Payer: Medicare Other

## 2019-09-16 NOTE — ED Notes (Addendum)
Per screener, patient left after a lockdown announcement was made.

## 2020-06-23 ENCOUNTER — Other Ambulatory Visit: Payer: Self-pay | Admitting: Family Medicine

## 2020-06-23 DIAGNOSIS — R2241 Localized swelling, mass and lump, right lower limb: Secondary | ICD-10-CM

## 2020-06-24 ENCOUNTER — Ambulatory Visit
Admission: RE | Admit: 2020-06-24 | Discharge: 2020-06-24 | Disposition: A | Discharge status: Home / Self Care | Payer: Medicare Other | Source: Ambulatory Visit | Attending: Family Medicine | Admitting: Family Medicine

## 2020-06-24 DIAGNOSIS — R2241 Localized swelling, mass and lump, right lower limb: Secondary | ICD-10-CM

## 2020-06-25 ENCOUNTER — Other Ambulatory Visit: Payer: Self-pay | Admitting: Family Medicine

## 2020-06-25 DIAGNOSIS — M7989 Other specified soft tissue disorders: Secondary | ICD-10-CM

## 2020-09-23 ENCOUNTER — Other Ambulatory Visit: Payer: Self-pay | Admitting: Orthopedic Surgery

## 2020-09-24 ENCOUNTER — Encounter (HOSPITAL_BASED_OUTPATIENT_CLINIC_OR_DEPARTMENT_OTHER)
Admission: RE | Admit: 2020-09-24 | Discharge: 2020-09-24 | Disposition: A | Discharge status: Home / Self Care | Payer: Medicare Other | Source: Ambulatory Visit | Attending: Orthopedic Surgery | Admitting: Orthopedic Surgery

## 2020-09-24 ENCOUNTER — Encounter (HOSPITAL_BASED_OUTPATIENT_CLINIC_OR_DEPARTMENT_OTHER): Payer: Self-pay | Admitting: Orthopedic Surgery

## 2020-09-24 DIAGNOSIS — Z01812 Encounter for preprocedural laboratory examination: Secondary | ICD-10-CM | POA: Insufficient documentation

## 2020-09-24 LAB — BASIC METABOLIC PANEL
Anion gap: 6 (ref 5–15)
BUN: 19 mg/dL (ref 8–23)
CO2: 26 mmol/L (ref 22–32)
Calcium: 9.6 mg/dL (ref 8.9–10.3)
Chloride: 107 mmol/L (ref 98–111)
Creatinine, Ser: 0.85 mg/dL (ref 0.44–1.00)
GFR, Estimated: >60 mL/min (ref >60)
Glucose, Bld: 88 mg/dL (ref 70–99)
Potassium: 4.3 mmol/L (ref 3.5–5.1)
Sodium: 139 mmol/L (ref 135–145)

## 2020-09-24 NOTE — Progress Notes (Signed)

## 2020-09-28 ENCOUNTER — Ambulatory Visit (HOSPITAL_BASED_OUTPATIENT_CLINIC_OR_DEPARTMENT_OTHER): Payer: Medicare Other | Admitting: Certified Registered"

## 2020-09-28 ENCOUNTER — Other Ambulatory Visit: Payer: Self-pay

## 2020-09-28 ENCOUNTER — Encounter (HOSPITAL_BASED_OUTPATIENT_CLINIC_OR_DEPARTMENT_OTHER): Payer: Self-pay | Admitting: Orthopedic Surgery

## 2020-09-28 ENCOUNTER — Encounter (HOSPITAL_BASED_OUTPATIENT_CLINIC_OR_DEPARTMENT_OTHER)
Admission: RE | Disposition: A | Discharge status: Home / Self Care | Payer: Self-pay | Source: Home / Self Care | Attending: Orthopedic Surgery

## 2020-09-28 ENCOUNTER — Ambulatory Visit (HOSPITAL_BASED_OUTPATIENT_CLINIC_OR_DEPARTMENT_OTHER)
Admission: RE | Admit: 2020-09-28 | Discharge: 2020-09-28 | Disposition: A | Discharge status: Home / Self Care | Payer: Medicare Other | Attending: Orthopedic Surgery | Admitting: Orthopedic Surgery

## 2020-09-28 DIAGNOSIS — Z6836 Body mass index (BMI) 36.0-36.9, adult: Secondary | ICD-10-CM | POA: Diagnosis not present

## 2020-09-28 DIAGNOSIS — Z888 Allergy status to other drugs, medicaments and biological substances status: Secondary | ICD-10-CM | POA: Insufficient documentation

## 2020-09-28 DIAGNOSIS — Z79899 Other long term (current) drug therapy: Secondary | ICD-10-CM | POA: Diagnosis not present

## 2020-09-28 DIAGNOSIS — Z87891 Personal history of nicotine dependence: Secondary | ICD-10-CM | POA: Insufficient documentation

## 2020-09-28 DIAGNOSIS — M65331 Trigger finger, right middle finger: Secondary | ICD-10-CM | POA: Insufficient documentation

## 2020-09-28 HISTORY — PX: TRIGGER FINGER RELEASE: SHX641

## 2020-09-28 SURGERY — RELEASE, A1 PULLEY, FOR TRIGGER FINGER
Anesthesia: Monitor Anesthesia Care | Site: Hand | Laterality: Right

## 2020-09-28 MED ORDER — FENTANYL CITRATE (PF) 100 MCG/2ML IJ SOLN
INTRAMUSCULAR | Status: DC | PRN
  Administered 2020-09-28: 100 ug via INTRAVENOUS

## 2020-09-28 MED ORDER — DEXAMETHASONE SODIUM PHOSPHATE 10 MG/ML IJ SOLN
INTRAMUSCULAR | Status: AC
  Filled 2020-09-28: qty 1

## 2020-09-28 MED ORDER — FENTANYL CITRATE (PF) 100 MCG/2ML IJ SOLN
INTRAMUSCULAR | Status: AC
  Filled 2020-09-28: qty 2

## 2020-09-28 MED ORDER — BUPIVACAINE HCL (PF) 0.25 % IJ SOLN
INTRAMUSCULAR | Status: AC | PRN
  Administered 2020-09-28: 5 mL/h

## 2020-09-28 MED ORDER — MIDAZOLAM HCL 2 MG/2ML IJ SOLN
INTRAMUSCULAR | Status: AC
  Filled 2020-09-28: qty 2

## 2020-09-28 MED ORDER — PROPOFOL 10 MG/ML IV BOLUS
INTRAVENOUS | Status: DC | PRN
  Administered 2020-09-28: 20 mg via INTRAVENOUS

## 2020-09-28 MED ORDER — FENTANYL CITRATE (PF) 100 MCG/2ML IJ SOLN: 25.0000 ug | INTRAMUSCULAR | Status: DC | PRN

## 2020-09-28 MED ORDER — HYDROCODONE-ACETAMINOPHEN 5-325 MG PO TABS: ORAL_TABLET | ORAL | 0 refills | Status: DC

## 2020-09-28 MED ORDER — 0.9 % SODIUM CHLORIDE (POUR BTL) OPTIME
TOPICAL | Status: DC | PRN
  Administered 2020-09-28: 200 mL

## 2020-09-28 MED ORDER — DEXMEDETOMIDINE (PRECEDEX) IN NS 20 MCG/5ML (4 MCG/ML) IV SYRINGE
PREFILLED_SYRINGE | INTRAVENOUS | Status: AC
  Filled 2020-09-28: qty 5

## 2020-09-28 MED ORDER — DEXMEDETOMIDINE (PRECEDEX) IN NS 20 MCG/5ML (4 MCG/ML) IV SYRINGE
PREFILLED_SYRINGE | INTRAVENOUS | Status: DC | PRN
  Administered 2020-09-28: 8 ug via INTRAVENOUS

## 2020-09-28 MED ORDER — LIDOCAINE HCL (PF) 0.5 % IJ SOLN
INTRAMUSCULAR | Status: DC | PRN
  Administered 2020-09-28: 30 mL via INTRAVENOUS

## 2020-09-28 MED ORDER — CEFAZOLIN SODIUM-DEXTROSE 2-4 GM/100ML-% IV SOLN
INTRAVENOUS | Status: AC
  Filled 2020-09-28: qty 100

## 2020-09-28 MED ORDER — CEFAZOLIN SODIUM-DEXTROSE 2-4 GM/100ML-% IV SOLN
2.0000 g | INTRAVENOUS | Status: AC
  Administered 2020-09-28: 2 g via INTRAVENOUS

## 2020-09-28 MED ORDER — MIDAZOLAM HCL 5 MG/5ML IJ SOLN
INTRAMUSCULAR | Status: DC | PRN
  Administered 2020-09-28: 2 mg via INTRAVENOUS

## 2020-09-28 MED ORDER — ONDANSETRON HCL 4 MG/2ML IJ SOLN
INTRAMUSCULAR | Status: AC
  Filled 2020-09-28: qty 2

## 2020-09-28 MED ORDER — LACTATED RINGERS IV SOLN: INTRAVENOUS | Status: DC

## 2020-09-28 SURGICAL SUPPLY — 31 items
APL PRP STRL LF DISP 70% ISPRP (MISCELLANEOUS) ×1
BLADE SURG 15 STRL LF DISP TIS (BLADE) ×2 IMPLANT
BLADE SURG 15 STRL SS (BLADE) ×4
BNDG CMPR 9X4 STRL LF SNTH (GAUZE/BANDAGES/DRESSINGS) ×1
BNDG COHESIVE 2X5 TAN ST LF (GAUZE/BANDAGES/DRESSINGS) ×2 IMPLANT
BNDG ESMARK 4X9 LF (GAUZE/BANDAGES/DRESSINGS) ×1 IMPLANT
CHLORAPREP W/TINT 26 (MISCELLANEOUS) ×2 IMPLANT
CORD BIPOLAR FORCEPS 12FT (ELECTRODE) ×2 IMPLANT
COVER BACK TABLE 60X90IN (DRAPES) ×2 IMPLANT
COVER MAYO STAND STRL (DRAPES) ×2 IMPLANT
CUFF TOURN SGL QUICK 18X4 (TOURNIQUET CUFF) ×1 IMPLANT
DRAPE EXTREMITY T 121X128X90 (DISPOSABLE) ×2 IMPLANT
DRAPE SURG 17X23 STRL (DRAPES) ×2 IMPLANT
GAUZE SPONGE 4X4 12PLY STRL (GAUZE/BANDAGES/DRESSINGS) ×2 IMPLANT
GAUZE XEROFORM 1X8 LF (GAUZE/BANDAGES/DRESSINGS) ×2 IMPLANT
GLOVE SRG 8 PF TXTR STRL LF DI (GLOVE) ×1 IMPLANT
GLOVE SURG ENC MOIS LTX SZ7.5 (GLOVE) ×2 IMPLANT
GLOVE SURG UNDER POLY LF SZ8 (GLOVE) ×2
GOWN STRL REUS W/ TWL LRG LVL3 (GOWN DISPOSABLE) ×1 IMPLANT
GOWN STRL REUS W/TWL LRG LVL3 (GOWN DISPOSABLE) ×2
GOWN STRL REUS W/TWL XL LVL3 (GOWN DISPOSABLE) ×2 IMPLANT
NDL HYPO 25X1 1.5 SAFETY (NEEDLE) ×1 IMPLANT
NEEDLE HYPO 25X1 1.5 SAFETY (NEEDLE) ×2 IMPLANT
NS IRRIG 1000ML POUR BTL (IV SOLUTION) ×2 IMPLANT
PACK BASIN DAY SURGERY FS (CUSTOM PROCEDURE TRAY) ×2 IMPLANT
STOCKINETTE 4X48 STRL (DRAPES) ×2 IMPLANT
SUT ETHILON 4 0 PS 2 18 (SUTURE) ×2 IMPLANT
SYR BULB EAR ULCER 3OZ GRN STR (SYRINGE) ×2 IMPLANT
SYR CONTROL 10ML LL (SYRINGE) ×2 IMPLANT
TOWEL GREEN STERILE FF (TOWEL DISPOSABLE) ×4 IMPLANT
UNDERPAD 30X36 HEAVY ABSORB (UNDERPADS AND DIAPERS) ×2 IMPLANT

## 2020-09-28 NOTE — Op Note (Signed)
09/28/2020 [facility]  Operative Note  PREOPERATIVE DIAGNOSIS: RIGHT LONG FINGER TRIGGER DIGIT  POSTOPERATIVE DIAGNOSIS:  RIGHT LONG FINGER TRIGGER DIGIT  PROCEDURE: Procedure(s): RELEASE TRIGGER FINGER/A-1 PULLEY RIGHT LONG FINGER  SURGEON:  Leanora Cover, MD  ASSISTANT:  none.  ANESTHESIA:  Bier block with sedation.  IV FLUIDS:  Per anesthesia flow sheet.  ESTIMATED BLOOD LOSS:  Minimal.  COMPLICATIONS:  None.  SPECIMENS:  None.  TOURNIQUET TIME:  Total Tourniquet Time Documented: Forearm (Right) - 18 minutes Total: Forearm (Right) - 18 minutes   DISPOSITION:  Stable to PACU.  LOCATION:  SURGERY CENTER  INDICATIONS: Marisa Ryan is a 65 y.o. female with triggering right long finger.  She is unable to have injections due to allergies.  She wishes to have right long finger trigger release.  Risks, benefits and alternatives of surgery were discussed including the risk of blood loss, infection, damage to nerves, vessels, tendons, ligaments, bone, failure of surgery, need for additional surgery, complications with wound healing, continued pain, continued triggering and need for repeat surgery.  She voiced understanding of these risks and elected to proceed.  OPERATIVE COURSE:  After being identified preoperatively by myself, the patient and I agreed upon the procedure and site of procedure.  The surgical site was marked. Surgical consent had been signed. She was given IV Ancef as preoperative antibiotic prophylaxis. She was transported to the operating room and placed on the operating room table in supine position with the Right upper extremity on an arm board. Bier block anesthesia was induced by the anesthesiologist.  The Right upper extremity was prepped and draped in normal sterile orthopedic fashion. A surgical pause was performed between surgeons, anesthesia, and operating room staff, and all were in agreement as to the patient, procedure, and site of  procedure.  Tourniquet at the proximal aspect of the forearm had been inflated for the Bier block.  An incision was made at the volar aspect of the MP joint of the long finger.  This was carried into the subcutaneous tissues by preading technique.  Bipolar electrocautery was used to obtain hemostasis.  The radial and ulnar digital nerves were protected throughout the case. The flexor sheath was identified.  The A1 pulley was identified and sharply incised.  It was released in its entirety.  The proximal 1-2 mm of the A2 pulley was vented to allow better excursion of the tendons.  The finger was placed through a range of motion and there was noted to be no catching.  The tendons were brought through the wound and any adherences released.  The wound was then copiously irrigated with sterile saline. It was closed with 4-0 nylon in a horizontal mattress fashion.  It was injected with 0.25% plain Marcaine to aid in postoperative analgesia.  It was dressed with sterile Xeroform, 4x4s, and wrapped lightly with a Coban dressing.  Tourniquet was deflated at 18 minutes.  The fingertips were pink with brisk capillary refill after deflation of the tourniquet.  The operative drapes were broken down and the patient was awoken from anesthesia safely.  She was transferred back to the stretcher and taken to the PACU in stable condition.   I will see her back in the office in 1 week for postoperative followup.  I will give her a prescription for Norco 5/325 1-2 tabs PO q6 hours prn pain, dispense # 10.    Leanora Cover, MD Electronically signed, 09/28/20

## 2020-09-28 NOTE — Anesthesia Preprocedure Evaluation (Signed)
Anesthesia Evaluation  Patient identified by MRN, date of birth, ID band Patient awake    Reviewed: Allergy & Precautions, NPO status , Patient's Chart, lab work & pertinent test results  Airway Mallampati: II  TM Distance: >3 FB     Dental   Pulmonary former smoker,    breath sounds clear to auscultation       Cardiovascular hypertension,  Rhythm:Regular Rate:Normal     Neuro/Psych    GI/Hepatic GERD  ,  Endo/Other  negative endocrine ROS  Renal/GU Renal disease     Musculoskeletal   Abdominal   Peds  Hematology   Anesthesia Other Findings   Reproductive/Obstetrics                             Anesthesia Physical Anesthesia Plan  ASA: 3  Anesthesia Plan: MAC and Bier Block and Bier Block-LIDOCAINE ONLY   Post-op Pain Management:    Induction: Intravenous  PONV Risk Score and Plan: 2 and Ondansetron, Dexamethasone and Midazolam  Airway Management Planned: Nasal Cannula and Simple Face Mask  Additional Equipment:   Intra-op Plan:   Post-operative Plan:   Informed Consent: I have reviewed the patients History and Physical, chart, labs and discussed the procedure including the risks, benefits and alternatives for the proposed anesthesia with the patient or authorized representative who has indicated his/her understanding and acceptance.     Dental advisory given  Plan Discussed with: CRNA and Anesthesiologist  Anesthesia Plan Comments:         Anesthesia Quick Evaluation

## 2020-09-28 NOTE — Anesthesia Postprocedure Evaluation (Signed)
Anesthesia Post Note  Patient: Marisa Ryan  Procedure(s) Performed: RELEASE TRIGGER FINGER/A-1 PULLEY RIGHT LONG FINGER (Right: Hand)     Patient location during evaluation: PACU Anesthesia Type: MAC Level of consciousness: awake Pain management: pain level controlled Vital Signs Assessment: post-procedure vital signs reviewed and stable Respiratory status: spontaneous breathing Cardiovascular status: stable Postop Assessment: no apparent nausea or vomiting Anesthetic complications: no   No notable events documented.  Last Vitals:  Vitals:   09/28/20 1436 09/28/20 1449  BP: 116/67 118/62  Pulse: (!) 59 (!) 58  Resp: 11 12  Temp:  36.4 C  SpO2: 96% 97%    Last Pain:  Vitals:   09/28/20 1449  TempSrc:   PainSc: 0-No pain                 [name]

## 2020-09-28 NOTE — H&P (Addendum)
Marisa Ryan is an 65 y.o. female.   Chief Complaint: trigger digit HPI: 65 yo female with triggering right long finger.  This is bothersome to her.  She is unable to have injections due to allergies.  She wishes to have trigger release.  Allergies:  Allergies  Allergen Reactions   Cortisone Anaphylaxis and Shortness Of Breath    Felt hot. Turned face red.   Ivp Dye [Iodinated Diagnostic Agents] Anaphylaxis   Zofran Swelling    Past Medical History:  Diagnosis Date   Abdominal pain    Chest pain 09/12/2014   GERD (gastroesophageal reflux disease)    Hernia    Morbid obesity ([facility])    Nausea    Palpitations    Renal disorder    left nephrectomy   UTI (urinary tract infection)    history of them    Past Surgical History:  Procedure Laterality Date   ABDOMINAL HYSTERECTOMY  01/2003   BLADDER SUSPENSION  11/2002   BREATH TEK H PYLORI N/A 07/30/2014   Procedure: BREATH TEK H PYLORI;  Surgeon: Excell Seltzer, MD;  Location: [facility];  Service: General;  Laterality: N/A;   CESAREAN SECTION     COLONOSCOPY N/A 07/31/2015   Procedure: COLONOSCOPY;  Surgeon: Carol Ada, MD;  Location: [facility];  Service: Endoscopy;  Laterality: N/A;   GLAUCOMA SURGERY     HERNIA REPAIR     LAPAROSCOPIC CHOLECYSTECTOMY  11/01/10   Dr Lilyan Punt   removal of kidney  [id number]   left   UMBILICAL HERNIA REPAIR  11/01/10   Dr Lilyan Punt, no mesh    Family History: Family History  Problem Relation Age of Onset   Cancer Mother        breast   Heart disease Mother 55       [place]   Stroke Mother    Breast cancer Mother 33   Heart attack Father 12   Hypertension Brother    Stroke Maternal Aunt    Stroke Maternal Grandmother     Social History:   reports that she quit smoking about 29 years ago. Her smoking use included cigarettes. She has never used smokeless tobacco. She reports that she does not drink alcohol and does not use drugs.  Medications: Medications Prior to Admission   Medication Sig Dispense Refill   omeprazole (PRILOSEC) 40 MG capsule Take 40 mg by mouth every evening.      oxybutynin (DITROPAN-XL) 10 MG 24 hr tablet Take 10 mg by mouth at bedtime.       No results found for this or any previous visit (from the past 48 hour(s)).  No results found.   A comprehensive review of systems was negative.  Blood pressure 130/72, pulse 71, temperature 97.6 F (36.4 C), temperature source Oral, resp. rate 18, height '5\' 3"'$  (1.6 m), weight 93 kg, SpO2 98 %.  General appearance: alert, cooperative, and appears stated age Head: Normocephalic, without obvious abnormality, atraumatic Neck: supple, symmetrical, trachea midline Cardio: regular rate and rhythm Resp: clear to auscultation bilaterally Extremities: Intact sensation and capillary refill all digits.  +epl/fpl/io.  No wounds.  Pulses: 2+ and symmetric Skin: Skin color, texture, turgor normal. No rashes or lesions Neurologic: Grossly normal Incision/Wound: none  Assessment/Plan Right long finger trigger digit.  Non operative and operative treatment options have been discussed with the patient and patient wishes to proceed with operative treatment. Risks, benefits, and alternatives of surgery have been discussed and the patient agrees with the plan of  care.   Leanora Cover 09/28/2020, 12:52 PM

## 2020-09-28 NOTE — Discharge Instructions (Addendum)

## 2020-09-28 NOTE — Transfer of Care (Signed)
Immediate Anesthesia Transfer of Care Note  Patient: Marisa Ryan  Procedure(s) Performed: RELEASE TRIGGER FINGER/A-1 PULLEY RIGHT LONG FINGER (Right: Hand)  Patient Location: PACU  Anesthesia Type:Bier block  Level of Consciousness: awake, alert  and oriented  Airway & Oxygen Therapy: Patient Spontanous Breathing and Patient connected to face mask oxygen  Post-op Assessment: Report given to RN and Post -op Vital signs reviewed and stable  Post vital signs: Reviewed and stable  Last Vitals:  Vitals Value Taken Time  BP    Temp    Pulse 61 09/28/20 1426  Resp    SpO2 99 % 09/28/20 1426  Vitals shown include unvalidated device data.  Last Pain:  Vitals:   09/28/20 1224  TempSrc: Oral  PainSc: 2          Complications: No notable events documented.

## 2020-09-29 ENCOUNTER — Encounter (HOSPITAL_BASED_OUTPATIENT_CLINIC_OR_DEPARTMENT_OTHER): Payer: Self-pay | Admitting: Orthopedic Surgery

## 2021-10-02 ENCOUNTER — Other Ambulatory Visit: Payer: Self-pay

## 2021-10-02 ENCOUNTER — Emergency Department (HOSPITAL_BASED_OUTPATIENT_CLINIC_OR_DEPARTMENT_OTHER)
Admission: EM | Admit: 2021-10-02 | Discharge: 2021-10-03 | Disposition: A | Discharge status: Home / Self Care | Payer: Medicare Other | Attending: Emergency Medicine | Admitting: Emergency Medicine

## 2021-10-02 ENCOUNTER — Encounter (HOSPITAL_BASED_OUTPATIENT_CLINIC_OR_DEPARTMENT_OTHER): Payer: Self-pay

## 2021-10-02 DIAGNOSIS — R509 Fever, unspecified: Secondary | ICD-10-CM | POA: Diagnosis present

## 2021-10-02 DIAGNOSIS — U071 COVID-19: Secondary | ICD-10-CM | POA: Diagnosis not present

## 2021-10-02 DIAGNOSIS — N390 Urinary tract infection, site not specified: Secondary | ICD-10-CM

## 2021-10-02 DIAGNOSIS — Z87891 Personal history of nicotine dependence: Secondary | ICD-10-CM | POA: Insufficient documentation

## 2021-10-02 DIAGNOSIS — I10 Essential (primary) hypertension: Secondary | ICD-10-CM | POA: Diagnosis not present

## 2021-10-02 LAB — CBC WITH DIFFERENTIAL/PLATELET
Abs Immature Granulocytes: 0.02 10*3/uL (ref 0.00–0.07)
Basophils Absolute: 0 10*3/uL (ref 0.0–0.1)
Basophils Relative: 0 %
Eosinophils Absolute: 0.1 10*3/uL (ref 0.0–0.5)
Eosinophils Relative: 1 %
HCT: 43.5 % (ref 36.0–46.0)
Hemoglobin: 14.9 g/dL (ref 12.0–15.0)
Immature Granulocytes: 0 %
Lymphocytes Relative: 17 %
Lymphs Abs: 1.5 10*3/uL (ref 0.7–4.0)
MCH: 29.4 pg (ref 26.0–34.0)
MCHC: 34.3 g/dL (ref 30.0–36.0)
MCV: 85.8 fL (ref 80.0–100.0)
Monocytes Absolute: 0.8 10*3/uL (ref 0.1–1.0)
Monocytes Relative: 10 %
Neutro Abs: 6.2 10*3/uL (ref 1.7–7.7)
Neutrophils Relative %: 72 %
Platelets: 87 10*3/uL — ABNORMAL LOW (ref 150–400)
RBC: 5.07 MIL/uL (ref 3.87–5.11)
RDW: 12.9 % (ref 11.5–15.5)
WBC: 8.7 10*3/uL (ref 4.0–10.5)
nRBC: 0 % (ref 0.0–0.2)

## 2021-10-02 LAB — URINALYSIS, ROUTINE W REFLEX MICROSCOPIC
Bilirubin Urine: NEGATIVE
Glucose, UA: NEGATIVE mg/dL
Ketones, ur: NEGATIVE mg/dL
Nitrite: NEGATIVE
Specific Gravity, Urine: 1.018 (ref 1.005–1.030)
WBC, UA: >50 WBC/hpf — ABNORMAL HIGH (ref 0–5)
pH: 5.5 (ref 5.0–8.0)

## 2021-10-02 LAB — COMPREHENSIVE METABOLIC PANEL
ALT: 17 U/L (ref 0–44)
AST: 17 U/L (ref 15–41)
Albumin: 4.5 g/dL (ref 3.5–5.0)
Alkaline Phosphatase: 99 U/L (ref 38–126)
Anion gap: 13 (ref 5–15)
BUN: 20 mg/dL (ref 8–23)
CO2: 18 mmol/L — ABNORMAL LOW (ref 22–32)
Calcium: 9.8 mg/dL (ref 8.9–10.3)
Chloride: 106 mmol/L (ref 98–111)
Creatinine, Ser: 0.82 mg/dL (ref 0.44–1.00)
GFR, Estimated: >60 mL/min (ref >60)
Glucose, Bld: 105 mg/dL — ABNORMAL HIGH (ref 70–99)
Potassium: 4.2 mmol/L (ref 3.5–5.1)
Sodium: 137 mmol/L (ref 135–145)
Total Bilirubin: 0.4 mg/dL (ref 0.3–1.2)
Total Protein: 7.4 g/dL (ref 6.5–8.1)

## 2021-10-02 LAB — SARS CORONAVIRUS 2 BY RT PCR: SARS Coronavirus 2 by RT PCR: POSITIVE — AB

## 2021-10-02 MED ORDER — CEFDINIR 300 MG PO CAPS: 300.0000 mg | ORAL_CAPSULE | Freq: Two times a day (BID) | ORAL | 0 refills | Status: AC

## 2021-10-02 MED ORDER — NIRMATRELVIR/RITONAVIR (PAXLOVID)TABLET: 3.0000 | ORAL_TABLET | Freq: Two times a day (BID) | ORAL | 0 refills | Status: AC

## 2021-10-02 NOTE — ED Triage Notes (Signed)
Patient here POV from Home.  Endorses Significant Other becoming ill with COVID-19 recently and Patient began feeling Ill recently.  Symptoms include Fever, Chills, Nausea, Mild Diarrhea. Worse Today. Requesting Paxlovid or Similar Medication.   NAD Noted during Triage. A&Ox4. Gcs 15. Ambulatory.

## 2021-10-02 NOTE — Discharge Instructions (Addendum)
We evaluated you today for your viral symptoms.  Your COVID test was positive.  We have prescribed you a medicine called Paxlovid which can help reduce the length of your symptoms and reduce the need for hospitalization.  Your urinalysis also showed sign of urine infection.  We have started you on a medicine called cefdinir for your urinary infection.  Please follow-up with your primary doctor soon as possible.  Please return to the emergency department if you develop any new or worsening symptoms such as chest pain, shortness of breath, difficulty breathing, fainting, or any other concerning symptoms.

## 2021-10-03 NOTE — ED Provider Notes (Signed)
[facility] EMERGENCY DEPT Provider Note  CSN: [id number] Arrival date & time: 10/02/21 1926  Chief Complaint(s) Chills  HPI Marisa Ryan is a 66 y.o. female without significant past medical history presenting to the emergency department with fever.  Patient reports associated chills, myalgias, mild lower abdominal pain, nausea, diarrhea.  Reports associated cough.  No shortness of breath, chest pain.  Reports husband was positive for COVID.  She did not have a positive COVID test at home.  No modifying factors.  She also reports lower abdominal pain that is typical for chronic UTI.  Symptoms moderate.   Past Medical History Past Medical History:  Diagnosis Date   Abdominal pain    Chest pain 09/12/2014   GERD (gastroesophageal reflux disease)    Hernia    Morbid obesity (HCC)    Nausea    Palpitations    Renal disorder    left nephrectomy   UTI (urinary tract infection)    history of them   Patient Active Problem List   Diagnosis Date Noted   Former smoker 12/07/2016   Family history of coronary artery disease in mother 12/07/2016   Contrast media allergy 12/07/2016   Chest pain with moderate risk for cardiac etiology 09/12/2014   Palpitations    Morbid obesity ([place])    Postoperative wound infection 11/08/2010   DYSPNEA 10/15/2009   Essential hypertension 10/14/2009   Home Medication(s) Prior to Admission medications   Medication Sig Start Date End Date Taking? Authorizing Provider  cefdinir (OMNICEF) 300 MG capsule Take 1 capsule (300 mg total) by mouth 2 (two) times daily for 7 days. 10/02/21 10/09/21 Yes Cristie Hem, MD  nirmatrelvir/ritonavir EUA (PAXLOVID) 20 x 150 MG & 10 x 100MG TABS Take 3 tablets by mouth 2 (two) times daily for 5 days. Take nirmatrelvir (150 mg) two tablets twice daily for 5 days and ritonavir (100 mg) one tablet twice daily for 5 days. 10/02/21 10/07/21 Yes Cristie Hem, MD  HYDROcodone-acetaminophen [facility]) 5-325 MG  tablet 1-2 tabs po q6 hours prn pain 09/28/20   Leanora Cover, MD  omeprazole (PRILOSEC) 40 MG capsule Take 40 mg by mouth every evening.  07/28/10   [provider]  oxybutynin (DITROPAN-XL) 10 MG 24 hr tablet Take 10 mg by mouth at bedtime.  08/22/18   [provider]                                                                                                                                    Past Surgical History Past Surgical History:  Procedure Laterality Date   ABDOMINAL HYSTERECTOMY  01/2003   BLADDER SUSPENSION  11/2002   BREATH TEK H PYLORI N/A 07/30/2014   Procedure: BREATH TEK H PYLORI;  Surgeon: Excell Seltzer, MD;  Location: [facility];  Service: General;  Laterality: N/A;   CESAREAN SECTION     COLONOSCOPY N/A 07/31/2015   Procedure: COLONOSCOPY;  Surgeon: Carol Ada, MD;  Location: [facility];  Service: Endoscopy;  Laterality: N/A;   GLAUCOMA SURGERY     HERNIA REPAIR     LAPAROSCOPIC CHOLECYSTECTOMY  11/01/10   Dr Lilyan Punt   removal of kidney  2008   left   TRIGGER FINGER RELEASE Right 09/28/2020   Procedure: RELEASE TRIGGER FINGER/A-1 PULLEY RIGHT LONG FINGER;  Surgeon: Leanora Cover, MD;  Location: [facility];  Service: Orthopedics;  Laterality: Right;  MAC with bier block   UMBILICAL HERNIA REPAIR  11/01/10   Dr Lilyan Punt, no mesh   Family History Family History  Problem Relation Age of Onset   Cancer Mother        breast   Heart disease Mother 74       AOTRA REPAIR   Stroke Mother    Breast cancer Mother 53   Heart attack Father 14   Hypertension Brother    Stroke Maternal Aunt    Stroke Maternal Grandmother     Social History Social History   Tobacco Use   Smoking status: Former    Types: Cigarettes    Quit date: 05/24/1991    Years since quitting: 30.3   Smokeless tobacco: Never  Vaping Use   Vaping Use: Never used  Substance Use Topics   Alcohol use: No   Drug use: No   Allergies Cortisone, Ivp dye [iodinated  contrast media], and Zofran  Review of Systems Review of Systems  All other systems reviewed and are negative.   Physical Exam Vital Signs  I have reviewed the triage vital signs BP (!) 141/89 (BP Location: Right Arm)   Pulse 77   Temp 98.6 F (37 C) (Oral)   Resp 20   Ht _0  (1.6 m)   Wt 93 kg   SpO2 97%   BMI 36.32 kg/m  Physical Exam Vitals and nursing note reviewed.  Constitutional:      General: She is not in acute distress.    Appearance: She is well-developed.  HENT:     Head: Normocephalic and atraumatic.     Mouth/Throat:     Mouth: Mucous membranes are moist.  Eyes:     Pupils: Pupils are equal, round, and reactive to light.  Cardiovascular:     Rate and Rhythm: Normal rate and regular rhythm.     Heart sounds: No murmur heard. Pulmonary:     Effort: Pulmonary effort is normal. No respiratory distress.     Breath sounds: Normal breath sounds.  Abdominal:     General: Abdomen is flat.     Palpations: Abdomen is soft.     Tenderness: There is no abdominal tenderness. There is no right CVA tenderness or left CVA tenderness.  Musculoskeletal:        General: No tenderness.     Right lower leg: No edema.     Left lower leg: No edema.  Skin:    General: Skin is warm and dry.  Neurological:     General: No focal deficit present.     Mental Status: She is alert. Mental status is at baseline.  Psychiatric:        Mood and Affect: Mood normal.        Behavior: Behavior normal.     ED Results and Treatments Labs (all labs ordered are listed, but only abnormal results are displayed) Labs Reviewed  SARS CORONAVIRUS 2 BY RT PCR - Abnormal; Notable for the following components:      Result  Value   SARS Coronavirus 2 by RT PCR POSITIVE (*)    All other components within normal limits  COMPREHENSIVE METABOLIC PANEL - Abnormal; Notable for the following components:   CO2 18 (*)    Glucose, Bld 105 (*)    All other components within normal limits  CBC WITH  DIFFERENTIAL/PLATELET - Abnormal; Notable for the following components:   Platelets 87 (*)    All other components within normal limits  URINALYSIS, ROUTINE W REFLEX MICROSCOPIC - Abnormal; Notable for the following components:   APPearance HAZY (*)    Hgb urine dipstick TRACE (*)    Protein, ur TRACE (*)    Leukocytes,Ua LARGE (*)    WBC, UA >50 (*)    Bacteria, UA MANY (*)    All other components within normal limits  URINE CULTURE                                                                                                                          Radiology No results found.  Pertinent labs & imaging results that were available during my care of the patient were reviewed by me and considered in my medical decision making (see MDM for details).  Medications Ordered in ED Medications - No data to display                                                                                                                                   Procedures Procedures  (including critical care time)  Medical Decision Making / ED Course   MDM:  66 year old female presenting to the emergency department with URI symptoms.  COVID test positive.  Will initiate Paxlovid as her symptoms began yesterday.  Discussed return precautions for COVID.  No focal lung signs to suggest focal pneumonia.  Urinalysis was significant for leukocytes, bacteria.  Will send urine culture.  Patient reports significant history of recurrent urinary infection and reports lower abdominal pain consistent with this.  Will start on cefdinir.  No CVA tenderness to suggest pyelonephritis. Will discharge patient to home. All questions answered. Patient comfortable with plan of discharge. Return precautions discussed with patient and specified on the after visit summary.       Additional history obtained: -Additional history obtained from husband -External records from outside source obtained and reviewed including: Chart  review including previous notes, labs, imaging, consultation notes   Lab Tests: -I  ordered, reviewed, and interpreted labs.   The pertinent results include:   Labs Reviewed  SARS CORONAVIRUS 2 BY RT PCR - Abnormal; Notable for the following components:      Result Value   SARS Coronavirus 2 by RT PCR POSITIVE (*)    All other components within normal limits  COMPREHENSIVE METABOLIC PANEL - Abnormal; Notable for the following components:   CO2 18 (*)    Glucose, Bld 105 (*)    All other components within normal limits  CBC WITH DIFFERENTIAL/PLATELET - Abnormal; Notable for the following components:   Platelets 87 (*)    All other components within normal limits  URINALYSIS, ROUTINE W REFLEX MICROSCOPIC - Abnormal; Notable for the following components:   APPearance HAZY (*)    Hgb urine dipstick TRACE (*)    Protein, ur TRACE (*)    Leukocytes,Ua LARGE (*)    WBC, UA >50 (*)    Bacteria, UA MANY (*)    All other components within normal limits  URINE CULTURE      EKG   EKG Interpretation  Date/Time:    Ventricular Rate:    PR Interval:    QRS Duration:   QT Interval:    QTC Calculation:   R Axis:     Text Interpretation:            Medicines ordered and prescription drug management: Meds ordered this encounter  Medications   nirmatrelvir/ritonavir EUA (PAXLOVID) 20 x 150 MG & 10 x 100MG TABS    Sig: Take 3 tablets by mouth 2 (two) times daily for 5 days. Take nirmatrelvir (150 mg) two tablets twice daily for 5 days and ritonavir (100 mg) one tablet twice daily for 5 days.    Dispense:  30 tablet    Refill:  0   cefdinir (OMNICEF) 300 MG capsule    Sig: Take 1 capsule (300 mg total) by mouth 2 (two) times daily for 7 days.    Dispense:  14 capsule    Refill:  0    -I have reviewed the patients home medicines and have made adjustments as needed     Cardiac Monitoring: The patient was maintained on a cardiac monitor.  I personally viewed and interpreted  the cardiac monitored which showed an underlying rhythm of: NSR  Social Determinants of Health:  Factors impacting patients care include: former smoker   Reevaluation: After the interventions noted above, I reevaluated the patient and found that they have :improved  Co morbidities that complicate the patient evaluation  Past Medical History:  Diagnosis Date   Abdominal pain    Chest pain 09/12/2014   GERD (gastroesophageal reflux disease)    Hernia    Morbid obesity ([place])    Nausea    Palpitations    Renal disorder    left nephrectomy   UTI (urinary tract infection)    history of them      Dispostion: Discharge     Final Clinical Impression(s) / ED Diagnoses Final diagnoses:  COVID-19  Urinary tract infection without hematuria, site unspecified     This chart was dictated using voice recognition software.  Despite best efforts to proofread,  errors can occur which can change the documentation meaning.    Cristie Hem, MD 10/03/21 Shelah Lewandowsky

## 2021-10-05 LAB — URINE CULTURE: Culture: >=100000 — AB

## 2021-10-06 ENCOUNTER — Telehealth: Payer: Self-pay

## 2021-10-06 NOTE — Telephone Encounter (Signed)
Post ED Visit - Positive Culture Follow-up  Culture report reviewed by antimicrobial stewardship pharmacist: [facility] Team '[x]'$  Esmeralda Arthur, Pharm.D. '[]'$  Heide Guile, Pharm.D., BCPS AQ-ID '[]'$  Parks Neptune, Pharm.D., BCPS '[]'$  Alycia Rossetti, Pharm.D., BCPS '[]'$  Wallis, Pharm.D., BCPS, AAHIVP '[]'$  Legrand Como, Pharm.D., BCPS, AAHIVP '[]'$  Salome Arnt, PharmD, BCPS '[]'$  Johnnette Gourd, PharmD, BCPS '[]'$  Hughes Better, PharmD, BCPS '[]'$  Leeroy Cha, PharmD '[]'$  Laqueta Linden, PharmD, BCPS '[]'$  Albertina Parr, PharmD  [facility] Team '[]'$  Leodis Sias, PharmD '[]'$  Lindell Spar, PharmD '[]'$  Royetta Asal, PharmD '[]'$  Graylin Shiver, Rph '[]'$  Rema Fendt) Glennon Mac, PharmD '[]'$  Arlyn Dunning, PharmD '[]'$  Netta Cedars, PharmD '[]'$  Dia Sitter, PharmD '[]'$  Leone Haven, PharmD '[]'$  Gretta Arab, PharmD '[]'$  Theodis Shove, PharmD '[]'$  Peggyann Juba, PharmD '[]'$  Reuel Boom, PharmD   Positive urine culture Treated with Cefdinir, organism sensitive to the same and no further patient follow-up is required at this time.  Glennon Hamilton 10/06/2021, 11:36 AM

## 2021-11-24 ENCOUNTER — Other Ambulatory Visit: Payer: Self-pay | Admitting: Family Medicine

## 2021-11-24 DIAGNOSIS — Z1231 Encounter for screening mammogram for malignant neoplasm of breast: Secondary | ICD-10-CM

## 2021-11-24 DIAGNOSIS — E2839 Other primary ovarian failure: Secondary | ICD-10-CM

## 2021-12-06 ENCOUNTER — Emergency Department (HOSPITAL_COMMUNITY): Payer: Medicare Other

## 2021-12-06 ENCOUNTER — Encounter (HOSPITAL_COMMUNITY): Payer: Self-pay

## 2021-12-06 ENCOUNTER — Emergency Department (HOSPITAL_COMMUNITY)
Admission: EM | Admit: 2021-12-06 | Discharge: 2021-12-07 | Disposition: A | Discharge status: Home / Self Care | Payer: Medicare Other | Attending: Student | Admitting: Student

## 2021-12-06 ENCOUNTER — Other Ambulatory Visit: Payer: Self-pay

## 2021-12-06 DIAGNOSIS — M25561 Pain in right knee: Secondary | ICD-10-CM | POA: Diagnosis not present

## 2021-12-06 DIAGNOSIS — I10 Essential (primary) hypertension: Secondary | ICD-10-CM | POA: Insufficient documentation

## 2021-12-06 DIAGNOSIS — Z87891 Personal history of nicotine dependence: Secondary | ICD-10-CM | POA: Insufficient documentation

## 2021-12-06 DIAGNOSIS — Z79899 Other long term (current) drug therapy: Secondary | ICD-10-CM | POA: Diagnosis not present

## 2021-12-06 DIAGNOSIS — W109XXA Fall (on) (from) unspecified stairs and steps, initial encounter: Secondary | ICD-10-CM | POA: Insufficient documentation

## 2021-12-06 DIAGNOSIS — S82001A Unspecified fracture of right patella, initial encounter for closed fracture: Secondary | ICD-10-CM

## 2021-12-06 DIAGNOSIS — S82014A Nondisplaced osteochondral fracture of right patella, initial encounter for closed fracture: Secondary | ICD-10-CM | POA: Diagnosis not present

## 2021-12-06 DIAGNOSIS — S8991XA Unspecified injury of right lower leg, initial encounter: Secondary | ICD-10-CM | POA: Diagnosis present

## 2021-12-06 DIAGNOSIS — W19XXXA Unspecified fall, initial encounter: Secondary | ICD-10-CM

## 2021-12-06 MED ORDER — OXYCODONE-ACETAMINOPHEN 5-325 MG PO TABS
1.0000 | ORAL_TABLET | ORAL | Status: DC | PRN
  Administered 2021-12-06: 1 via ORAL
  Filled 2021-12-06: qty 1

## 2021-12-06 NOTE — ED Triage Notes (Signed)
Patient tripped going up steps falling on right knee and chin hitting deck.  Complains of right knee pain, jaw pain and left shoulder pain.

## 2021-12-06 NOTE — ED Provider Triage Note (Signed)
Emergency Medicine Provider Triage Evaluation Note  Marisa Ryan , a 66 y.o. female  was evaluated in triage.  Pt complains of right knee pain after a fall that occurred earlier.  Tripped and fell while blowing leaves.  Also says that she struck her jaw and left shoulder.  No thinners  Review of Systems  Positive:  Negative:   Physical Exam  BP 133/82 (BP Location: Left Arm)   Pulse 62   Temp 97.9 F (36.6 C)   Resp 16   Ht '5\' 3"'$  (1.6 m)   Wt 93 kg   SpO2 96%   BMI 36.31 kg/m  Gen:   Awake, no distress   Resp:  Normal effort  MSK:   Moves extremities without difficulty  Other:  Full range of motion of left shoulder.  No trismus noted on physical exam.  1 mildly loose tooth however all teeth in poor dentition.  Abrasion and swelling over the anterior right knee.  Able to extend the knee  Medical Decision Making  Medically screening exam initiated at 4:23 PM.  Appropriate orders placed.  Mega ROSABEL SERMENO was informed that the remainder of the evaluation will be completed by another provider, this initial triage assessment does not replace that evaluation, and the importance of remaining in the ED until their evaluation is complete.     Rhae Hammock, PA-C 12/06/21 1624

## 2021-12-07 MED ORDER — OXYCODONE-ACETAMINOPHEN 5-325 MG PO TABS
1.0000 | ORAL_TABLET | Freq: Once | ORAL | Status: AC
  Administered 2021-12-07: 1 via ORAL
  Filled 2021-12-07: qty 1

## 2021-12-07 MED ORDER — OXYCODONE-ACETAMINOPHEN 5-325 MG PO TABS: 1.0000 | ORAL_TABLET | ORAL | 0 refills | Status: DC | PRN

## 2021-12-07 NOTE — ED Provider Notes (Signed)
Care assumed from Dr. Matilde Sprang.  At time of transfer of care, patient awaiting for discussion with orthopedics on timeline for follow-up and who to follow-up with.  Spoke to orthopedics team who recommended knee immobilization and follow-up either later this week or early next with Dr. Mable Fill with orthopedics.  Patient and family agree with this plan and patient given prescription for pain medicine.  She understood return precautions and plan of care and was discharged in good condition.  Clinical Impression: 1. Fall, initial encounter   2. Closed nondisplaced fracture of right patella, unspecified fracture morphology, initial encounter     Disposition: Discharge  Condition: Good  I have discussed the results, Dx and Tx plan with the pt(& family if present). He/she/they expressed understanding and agree(s) with the plan. Discharge instructions discussed at great length. Strict return precautions discussed and pt &/or family have verbalized understanding of the instructions. No further questions at time of discharge.    New Prescriptions   OXYCODONE-ACETAMINOPHEN (PERCOCET/ROXICET) 5-325 MG TABLET    Take 1 tablet by mouth every 4 (four) hours as needed.    Follow Up: Georgeanna Harrison, MD [place] [address]  [id number] [phone]   with ortho  [place] [address] [phone] mc [place]       Camren Henthorn, Gwenyth Allegra, MD 12/07/21 [phone]

## 2021-12-07 NOTE — Discharge Instructions (Signed)
Your history, exam, work-up today revealed an acute patellar (knee cap) fracture on the right leg.  We spoke with orthopedics who would like to see you later this week or early next week for further evaluation.  Please use the knee immobilizer to help with stability and use the pain medicine.  Elevating your leg walks to also help with the swelling.  Please rest and stay hydrated.  If any symptoms change or worsen acutely, please return to the nearest emergency department.

## 2021-12-07 NOTE — ED Notes (Signed)
Brought back for reassessment. Pain meds being administered.

## 2021-12-07 NOTE — ED Notes (Signed)
Triage nurse Ysidro Evert, RN) made aware pt is in pain and would like pain meds.

## 2021-12-07 NOTE — Progress Notes (Signed)
Orthopedic Tech Progress Note Patient Details:  Marisa Ryan 12/10/1955 [id number]  Ortho Devices Type of Ortho Device: Knee Immobilizer Ortho Device/Splint Location: RLE Ortho Device/Splint Interventions: Ordered, Application, Adjustment   Post Interventions Patient Tolerated: Well, Difficulty with ambulation Instructions Provided: Care of device  [name] 12/07/2021, 7:56 AM

## 2021-12-07 NOTE — ED Provider Notes (Signed)
[facility] EMERGENCY DEPARTMENT Provider Note  CSN: [id number] Arrival date & time: 12/06/21 1613  Chief Complaint(s) Fall  HPI Marisa Ryan is a 66 y.o. female with PMH left-sided nephrectomy who presents emergency department for evaluation of right knee pain after fall.  Patient states that she was blowing leaves in the backyard when she tripped and landed on her right knee.  She states she heard a crack and felt immediate nausea from the pain.  She is currently having difficulty bearing weight on the leg.  Also fell forward onto her face and arrives with bruising to the left lateral chin.  Also complaining of left shoulder pain.  Denies numbness, tingling, weakness, chest pain, shortness of breath or other systemic or traumatic complaints.   Past Medical History Past Medical History:  Diagnosis Date   Abdominal pain    Chest pain 09/12/2014   GERD (gastroesophageal reflux disease)    Hernia    Morbid obesity (HCC)    Nausea    Palpitations    Renal disorder    left nephrectomy   UTI (urinary tract infection)    history of them   Patient Active Problem List   Diagnosis Date Noted   Former smoker 12/07/2016   Family history of coronary artery disease in mother 12/07/2016   Contrast media allergy 12/07/2016   Chest pain with moderate risk for cardiac etiology 09/12/2014   Palpitations    Morbid obesity ([place])    Postoperative wound infection 11/08/2010   DYSPNEA 10/15/2009   Essential hypertension 10/14/2009   Home Medication(s) Prior to Admission medications   Medication Sig Start Date End Date Taking? Authorizing Provider  HYDROcodone-acetaminophen (NORCO) 5-325 MG tablet 1-2 tabs po q6 hours prn pain 09/28/20   Leanora Cover, MD  omeprazole (PRILOSEC) 40 MG capsule Take 40 mg by mouth every evening.  07/28/10   [provider]  oxybutynin (DITROPAN-XL) 10 MG 24 hr tablet Take 10 mg by mouth at bedtime.  08/22/18   [provider]                                                                                                                                     Past Surgical History Past Surgical History:  Procedure Laterality Date   ABDOMINAL HYSTERECTOMY  01/2003   BLADDER SUSPENSION  11/2002   BREATH TEK H PYLORI N/A 07/30/2014   Procedure: BREATH TEK H PYLORI;  Surgeon: Excell Seltzer, MD;  Location: [facility];  Service: General;  Laterality: N/A;   CESAREAN SECTION     COLONOSCOPY N/A 07/31/2015   Procedure: COLONOSCOPY;  Surgeon: Carol Ada, MD;  Location: [facility];  Service: Endoscopy;  Laterality: N/A;   GLAUCOMA SURGERY     HERNIA REPAIR     LAPAROSCOPIC CHOLECYSTECTOMY  11/01/10   Dr Lilyan Punt   removal of kidney  2008   left   TRIGGER FINGER RELEASE Right 09/28/2020  Procedure: RELEASE TRIGGER FINGER/A-1 PULLEY RIGHT LONG FINGER;  Surgeon: Leanora Cover, MD;  Location: [facility];  Service: Orthopedics;  Laterality: Right;  MAC with bier block   UMBILICAL HERNIA REPAIR  11/01/10   Dr Lilyan Punt, no mesh   Family History Family History  Problem Relation Age of Onset   Cancer Mother        breast   Heart disease Mother 33       AOTRA REPAIR   Stroke Mother    Breast cancer Mother 75   Heart attack Father 96   Hypertension Brother    Stroke Maternal Aunt    Stroke Maternal Grandmother     Social History Social History   Tobacco Use   Smoking status: Former    Types: Cigarettes    Quit date: 05/24/1991    Years since quitting: 30.5   Smokeless tobacco: Never  Vaping Use   Vaping Use: Never used  Substance Use Topics   Alcohol use: No   Drug use: No   Allergies Cortisone, Ivp dye [iodinated contrast media], and Zofran  Review of Systems Review of Systems  Musculoskeletal:  Positive for arthralgias and myalgias.    Physical Exam Vital Signs  I have reviewed the triage vital signs BP 113/73 (BP Location: Right Arm)   Pulse (!) 54   Temp 97.7 F (36.5 C) (Oral)   Resp  14   Ht _0  (1.6 m)   Wt 93 kg   SpO2 98%   BMI 36.31 kg/m   Physical Exam Vitals and nursing note reviewed.  Constitutional:      General: She is not in acute distress.    Appearance: She is well-developed.  HENT:     Head: Normocephalic and atraumatic.  Eyes:     Conjunctiva/sclera: Conjunctivae normal.  Cardiovascular:     Rate and Rhythm: Normal rate and regular rhythm.     Heart sounds: No murmur heard. Pulmonary:     Effort: Pulmonary effort is normal. No respiratory distress.     Breath sounds: Normal breath sounds.  Abdominal:     Palpations: Abdomen is soft.     Tenderness: There is no abdominal tenderness.  Musculoskeletal:        General: Swelling, tenderness and deformity present.     Cervical back: Neck supple.  Skin:    General: Skin is warm and dry.     Capillary Refill: Capillary refill takes less than 2 seconds.  Neurological:     Mental Status: She is alert.  Psychiatric:        Mood and Affect: Mood normal.     ED Results and Treatments Labs (all labs ordered are listed, but only abnormal results are displayed) Labs Reviewed - No data to display                                                                                                                        Radiology CT Maxillofacial Wo Contrast  Result Date: 12/06/2021 CLINICAL DATA:  Trauma.  Jaw pain. EXAM: CT MAXILLOFACIAL WITHOUT CONTRAST TECHNIQUE: Multidetector CT imaging of the maxillofacial structures was performed. Multiplanar CT image reconstructions were also generated. RADIATION DOSE REDUCTION: This exam was performed according to the departmental dose-optimization program which includes automated exposure control, adjustment of the mA and/or kV according to patient size and/or use of iterative reconstruction technique. COMPARISON:  None Available. FINDINGS: Osseous: No fracture or mandibular dislocation. No destructive process. Orbits: Negative. No traumatic or inflammatory  finding. Sinuses: Clear. Left-sided nasal spur with leftward deviation of the nasal septum. Soft tissues: Negative. Limited intracranial: No significant or unexpected finding. IMPRESSION: No evidence of facial bone fracture. Electronically Signed   By: Marin Roberts M.D.   On: 12/06/2021 18:51   DG Shoulder Left  Result Date: 12/06/2021 CLINICAL DATA:  Fall EXAM: LEFT SHOULDER - 2+ VIEW COMPARISON:  None Available. FINDINGS: There is no acute fracture or dislocation. There is no evidence of arthropathy or other focal bone abnormality. There is a healed left fifth rib fracture. Soft tissues are unremarkable. IMPRESSION: 1. No acute fracture or dislocation. Electronically Signed   By: Ronney Asters M.D.   On: 12/06/2021 18:50    Pertinent labs & imaging results that were available during my care of the patient were reviewed by me and considered in my medical decision making (see MDM for details).  Medications Ordered in ED Medications  oxyCODONE-acetaminophen (PERCOCET/ROXICET) 5-325 MG per tablet 1 tablet (1 tablet Oral Given 12/06/21 1625)  oxyCODONE-acetaminophen (PERCOCET/ROXICET) 5-325 MG per tablet 1 tablet (1 tablet Oral Given 12/07/21 0342)                                                                                                                                     Procedures Procedures  (including critical care time)  Medical Decision Making / ED Course   This patient presents to the ED for concern of fall, knee pain, this involves an extensive number of treatment options, and is a complaint that carries with it a high risk of complications and morbidity.  The differential diagnosis includes fracture, ligamentous injury, dislocation, contusion  MDM: Seen emergency room for evaluation of multiple complaints described above.  Physical exam with tenderness over the right knee, jaw and shoulder.  X-ray imaging was significantly delayed due to inability to read the knee x-rays over  the patient's clothing.  Patient unfortunately had an extended ER lobby stay and by the time of my evaluation she had been waiting more than 10 hours.  X-rays repeated and her CT face is negative for facial fracture and the shoulder is without fracture but she does have an acute patellar fracture on the right.  Orthopedic recommendations are pending at time of signout.  Please see provider signout for continuation of work-up.     Additional history obtained: -Additional history obtained from husband -External records from outside source obtained and  reviewed including: Chart review including previous notes, labs, imaging, consultation notes    Imaging Studies ordered: I ordered imaging studies including shoulder x-ray, CT face, knee x-ray I independently visualized and interpreted imaging. I agree with the radiologist interpretation   Medicines ordered and prescription drug management: Meds ordered this encounter  Medications   oxyCODONE-acetaminophen (PERCOCET/ROXICET) 5-325 MG per tablet 1 tablet   oxyCODONE-acetaminophen (PERCOCET/ROXICET) 5-325 MG per tablet 1 tablet    -I have reviewed the patients home medicines and have made adjustments as needed  Critical interventions none  Consultations Obtained: I requested consultation with the orthopedic surgeon on-call,  and discussed lab and imaging findings as well as pertinent plan - they recommend: Recs currently pending    Social Determinants of Health:  Factors impacting patients care include: none   Reevaluation: After the interventions noted above, I reevaluated the patient and found that they have :stayed the same  Co morbidities that complicate the patient evaluation  Past Medical History:  Diagnosis Date   Abdominal pain    Chest pain 09/12/2014   GERD (gastroesophageal reflux disease)    Hernia    Morbid obesity ([place])    Nausea    Palpitations    Renal disorder    left nephrectomy   UTI (urinary tract  infection)    history of them      Dispostion: I considered admission for this patient, and disposition pending orthopedic recommendations.  Please see provider signout for continuation of work-up.     Final Clinical Impression(s) / ED Diagnoses Final diagnoses:  None     _0 @    Teressa Lower, MD 12/07/21 [phone]

## 2021-12-23 ENCOUNTER — Ambulatory Visit
Admission: RE | Admit: 2021-12-23 | Discharge: 2021-12-23 | Disposition: A | Discharge status: Home / Self Care | Payer: Medicare Other | Source: Ambulatory Visit | Attending: Family Medicine | Admitting: Family Medicine

## 2021-12-23 DIAGNOSIS — Z1231 Encounter for screening mammogram for malignant neoplasm of breast: Secondary | ICD-10-CM

## 2021-12-23 DIAGNOSIS — E2839 Other primary ovarian failure: Secondary | ICD-10-CM

## 2022-04-12 ENCOUNTER — Other Ambulatory Visit: Payer: Medicare Other

## 2022-09-12 ENCOUNTER — Other Ambulatory Visit: Payer: Self-pay | Admitting: Family Medicine

## 2022-09-12 DIAGNOSIS — E2839 Other primary ovarian failure: Secondary | ICD-10-CM

## 2022-09-13 ENCOUNTER — Inpatient Hospital Stay: Admission: RE | Admit: 2022-09-13 | Payer: Medicare Other | Source: Ambulatory Visit

## 2022-09-13 ENCOUNTER — Other Ambulatory Visit: Payer: Medicare Other

## 2023-03-15 ENCOUNTER — Other Ambulatory Visit: Payer: Medicare Other

## 2023-03-30 ENCOUNTER — Other Ambulatory Visit: Payer: Self-pay

## 2023-03-30 ENCOUNTER — Emergency Department (HOSPITAL_BASED_OUTPATIENT_CLINIC_OR_DEPARTMENT_OTHER): Payer: Medicare Other | Admitting: Radiology

## 2023-03-30 ENCOUNTER — Encounter (HOSPITAL_BASED_OUTPATIENT_CLINIC_OR_DEPARTMENT_OTHER): Payer: Self-pay | Admitting: Emergency Medicine

## 2023-03-30 ENCOUNTER — Observation Stay (HOSPITAL_BASED_OUTPATIENT_CLINIC_OR_DEPARTMENT_OTHER)
Admission: EM | Admit: 2023-03-30 | Discharge: 2023-03-31 | Disposition: A | Discharge status: Home / Self Care | Payer: Medicare Other | Attending: Internal Medicine | Admitting: Internal Medicine

## 2023-03-30 ENCOUNTER — Emergency Department (HOSPITAL_BASED_OUTPATIENT_CLINIC_OR_DEPARTMENT_OTHER): Payer: Medicare Other

## 2023-03-30 DIAGNOSIS — R200 Anesthesia of skin: Principal | ICD-10-CM | POA: Diagnosis present

## 2023-03-30 DIAGNOSIS — S0093XA Contusion of unspecified part of head, initial encounter: Secondary | ICD-10-CM | POA: Diagnosis present

## 2023-03-30 DIAGNOSIS — R002 Palpitations: Secondary | ICD-10-CM | POA: Diagnosis not present

## 2023-03-30 DIAGNOSIS — I639 Cerebral infarction, unspecified: Secondary | ICD-10-CM

## 2023-03-30 DIAGNOSIS — R079 Chest pain, unspecified: Secondary | ICD-10-CM | POA: Diagnosis not present

## 2023-03-30 DIAGNOSIS — Z905 Acquired absence of kidney: Secondary | ICD-10-CM | POA: Diagnosis not present

## 2023-03-30 DIAGNOSIS — W07XXXA Fall from chair, initial encounter: Secondary | ICD-10-CM | POA: Insufficient documentation

## 2023-03-30 DIAGNOSIS — M79605 Pain in left leg: Secondary | ICD-10-CM | POA: Diagnosis present

## 2023-03-30 DIAGNOSIS — E669 Obesity, unspecified: Secondary | ICD-10-CM | POA: Diagnosis present

## 2023-03-30 DIAGNOSIS — S0003XA Contusion of scalp, initial encounter: Secondary | ICD-10-CM | POA: Diagnosis not present

## 2023-03-30 DIAGNOSIS — R9082 White matter disease, unspecified: Secondary | ICD-10-CM | POA: Insufficient documentation

## 2023-03-30 DIAGNOSIS — Z6830 Body mass index (BMI) 30.0-30.9, adult: Secondary | ICD-10-CM | POA: Diagnosis not present

## 2023-03-30 DIAGNOSIS — R41841 Cognitive communication deficit: Secondary | ICD-10-CM | POA: Diagnosis not present

## 2023-03-30 DIAGNOSIS — Y92009 Unspecified place in unspecified non-institutional (private) residence as the place of occurrence of the external cause: Secondary | ICD-10-CM

## 2023-03-30 DIAGNOSIS — R29810 Facial weakness: Secondary | ICD-10-CM | POA: Diagnosis not present

## 2023-03-30 DIAGNOSIS — K219 Gastro-esophageal reflux disease without esophagitis: Secondary | ICD-10-CM | POA: Diagnosis not present

## 2023-03-30 DIAGNOSIS — W19XXXA Unspecified fall, initial encounter: Secondary | ICD-10-CM

## 2023-03-30 DIAGNOSIS — Z7901 Long term (current) use of anticoagulants: Secondary | ICD-10-CM | POA: Diagnosis not present

## 2023-03-30 DIAGNOSIS — Z79899 Other long term (current) drug therapy: Secondary | ICD-10-CM | POA: Diagnosis not present

## 2023-03-30 DIAGNOSIS — R030 Elevated blood-pressure reading, without diagnosis of hypertension: Secondary | ICD-10-CM | POA: Diagnosis not present

## 2023-03-30 LAB — HIV ANTIBODY (ROUTINE TESTING W REFLEX): HIV Screen 4th Generation wRfx: NONREACTIVE

## 2023-03-30 LAB — RAPID URINE DRUG SCREEN, HOSP PERFORMED
Amphetamines: NOT DETECTED
Barbiturates: NOT DETECTED
Benzodiazepines: NOT DETECTED
Cocaine: NOT DETECTED
Opiates: NOT DETECTED
Tetrahydrocannabinol: NOT DETECTED

## 2023-03-30 LAB — CBC WITH DIFFERENTIAL/PLATELET
Abs Immature Granulocytes: 0.01 10*3/uL (ref 0.00–0.07)
Basophils Absolute: 0 10*3/uL (ref 0.0–0.1)
Basophils Relative: 1 %
Eosinophils Absolute: 0.1 10*3/uL (ref 0.0–0.5)
Eosinophils Relative: 2 %
HCT: 38.4 % (ref 36.0–46.0)
Hemoglobin: 13.1 g/dL (ref 12.0–15.0)
Immature Granulocytes: 0 %
Lymphocytes Relative: 23 %
Lymphs Abs: 1.4 10*3/uL (ref 0.7–4.0)
MCH: 29.9 pg (ref 26.0–34.0)
MCHC: 34.1 g/dL (ref 30.0–36.0)
MCV: 87.7 fL (ref 80.0–100.0)
Monocytes Absolute: 0.4 10*3/uL (ref 0.1–1.0)
Monocytes Relative: 7 %
Neutro Abs: 3.9 10*3/uL (ref 1.7–7.7)
Neutrophils Relative %: 67 %
Platelets: 191 10*3/uL (ref 150–400)
RBC: 4.38 MIL/uL (ref 3.87–5.11)
RDW: 12.7 % (ref 11.5–15.5)
WBC: 5.9 10*3/uL (ref 4.0–10.5)
nRBC: 0 % (ref 0.0–0.2)

## 2023-03-30 LAB — BASIC METABOLIC PANEL
Anion gap: 7 (ref 5–15)
BUN: 18 mg/dL (ref 8–23)
CO2: 24 mmol/L (ref 22–32)
Calcium: 8.7 mg/dL — ABNORMAL LOW (ref 8.9–10.3)
Chloride: 110 mmol/L (ref 98–111)
Creatinine, Ser: 0.71 mg/dL (ref 0.44–1.00)
GFR, Estimated: >60 mL/min (ref >60)
Glucose, Bld: 85 mg/dL (ref 70–99)
Potassium: 3.8 mmol/L (ref 3.5–5.1)
Sodium: 141 mmol/L (ref 135–145)

## 2023-03-30 LAB — HEMOGLOBIN A1C
Hgb A1c MFr Bld: 5.1 % (ref 4.8–5.6)
Mean Plasma Glucose: 99.67 mg/dL

## 2023-03-30 LAB — LIPID PANEL
Cholesterol: 137 mg/dL (ref 0–200)
HDL: 35 mg/dL — ABNORMAL LOW (ref >40)
LDL Cholesterol: 83 mg/dL (ref 0–99)
Total CHOL/HDL Ratio: 3.9 {ratio}
Triglycerides: 97 mg/dL (ref <150)
VLDL: 19 mg/dL (ref 0–40)

## 2023-03-30 LAB — T4, FREE: Free T4: 0.76 ng/dL (ref 0.61–1.12)

## 2023-03-30 LAB — TROPONIN I (HIGH SENSITIVITY)
Troponin I (High Sensitivity): 3 ng/L (ref <18)
Troponin I (High Sensitivity): 3 ng/L (ref <18)

## 2023-03-30 LAB — ETHANOL: Alcohol, Ethyl (B): <10 mg/dL (ref <10)

## 2023-03-30 LAB — PROTIME-INR
INR: 1.1 (ref 0.8–1.2)
Prothrombin Time: 14 s (ref 11.4–15.2)

## 2023-03-30 LAB — TSH: TSH: 3.118 u[IU]/mL (ref 0.350–4.500)

## 2023-03-30 LAB — APTT: aPTT: 32 s (ref 24–36)

## 2023-03-30 LAB — MAGNESIUM: Magnesium: 1.9 mg/dL (ref 1.7–2.4)

## 2023-03-30 MED ORDER — OXYBUTYNIN CHLORIDE ER 5 MG PO TB24
15.0000 mg | ORAL_TABLET | Freq: Every day | ORAL | Status: DC
  Administered 2023-03-30: 15 mg via ORAL
  Filled 2023-03-30: qty 3

## 2023-03-30 MED ORDER — HYDROCODONE-ACETAMINOPHEN 5-325 MG PO TABS
1.0000 | ORAL_TABLET | Freq: Four times a day (QID) | ORAL | Status: DC | PRN
  Administered 2023-03-31: 1 via ORAL
  Filled 2023-03-30: qty 1

## 2023-03-30 MED ORDER — LORAZEPAM 2 MG/ML IJ SOLN
1.0000 mg | Freq: Once | INTRAMUSCULAR | Status: DC
  Filled 2023-03-30: qty 1

## 2023-03-30 MED ORDER — ASPIRIN 81 MG PO TBEC
81.0000 mg | DELAYED_RELEASE_TABLET | Freq: Every day | ORAL | Status: DC
  Administered 2023-03-30 – 2023-03-31 (×2): 81 mg via ORAL
  Filled 2023-03-30 (×2): qty 1

## 2023-03-30 MED ORDER — ACETAMINOPHEN 650 MG RE SUPP: 650.0000 mg | RECTAL | Status: DC | PRN

## 2023-03-30 MED ORDER — ENOXAPARIN SODIUM 40 MG/0.4ML IJ SOSY
40.0000 mg | PREFILLED_SYRINGE | INTRAMUSCULAR | Status: DC
  Administered 2023-03-30: 40 mg via SUBCUTANEOUS
  Filled 2023-03-30: qty 0.4

## 2023-03-30 MED ORDER — CALCIUM GLUCONATE-NACL 1-0.675 GM/50ML-% IV SOLN
1.0000 g | Freq: Once | INTRAVENOUS | Status: AC
  Administered 2023-03-30: 1000 mg via INTRAVENOUS
  Filled 2023-03-30: qty 50

## 2023-03-30 MED ORDER — LORAZEPAM 1 MG PO TABS
0.5000 mg | ORAL_TABLET | Freq: Once | ORAL | Status: AC
  Administered 2023-03-30: 0.5 mg via ORAL
  Filled 2023-03-30: qty 1

## 2023-03-30 MED ORDER — LORAZEPAM 2 MG/ML IJ SOLN
1.0000 mg | Freq: Once | INTRAMUSCULAR | Status: AC
  Administered 2023-03-30: 1 mg via INTRAVENOUS

## 2023-03-30 MED ORDER — STROKE: EARLY STAGES OF RECOVERY BOOK
Freq: Once | Status: AC
  Filled 2023-03-30: qty 1

## 2023-03-30 MED ORDER — PANTOPRAZOLE SODIUM 40 MG PO TBEC
40.0000 mg | DELAYED_RELEASE_TABLET | Freq: Every evening | ORAL | Status: DC
  Administered 2023-03-30: 40 mg via ORAL
  Filled 2023-03-30: qty 1

## 2023-03-30 MED ORDER — ACETAMINOPHEN 160 MG/5ML PO SOLN: 650.0000 mg | ORAL | Status: DC | PRN

## 2023-03-30 MED ORDER — SENNOSIDES-DOCUSATE SODIUM 8.6-50 MG PO TABS: 1.0000 | ORAL_TABLET | Freq: Every evening | ORAL | Status: DC | PRN

## 2023-03-30 MED ORDER — ACETAMINOPHEN 325 MG PO TABS
650.0000 mg | ORAL_TABLET | ORAL | Status: DC | PRN
  Administered 2023-03-30: 650 mg via ORAL
  Filled 2023-03-30: qty 2

## 2023-03-30 NOTE — ED Provider Notes (Signed)
 [place] EMERGENCY DEPARTMENT AT [facility] Provider Note   CSN: [id number] Arrival date & time: 03/30/23  4696     History  Chief Complaint  Patient presents with   [facility] Marisa Ryan is a 68 y.o. female.  HPI   68 year old female presents emergency department after fall from a broken chair.  Patient states that she was sitting on a chair trying to rearrange things on a shelf in the pantry when the chair broke from underneath her.  She fell back hitting the left side of her head.  She was able to get up with the assistance of her daughter but quickly developed a left-sided posterior headache that she described as sharp and is currently ongoing.  She is also complaining of sided hip and lower extremity pain but has been ambulatory.  She is otherwise been in her baseline health.  However after arriving here and getting placed in the patient room she had sudden onset of left-sided facial numbness.  She states this was involving the forehead, cheek and left side of the mouth.  Otherwise denies any vision change/loss, speech change, unilateral extremity weakness/numbness.  She has felt off balance since walking and hitting her head.  Home Medications Prior to Admission medications   Medication Sig Start Date End Date Taking? Authorizing Provider  albuterol (VENTOLIN HFA) 108 (90 Base) MCG/ACT inhaler Inhale 2 puffs into the lungs every 6 (six) hours as needed for wheezing or shortness of breath. 03/14/21   [provider]  ascorbic acid (VITAMIN C) 500 MG tablet Take 500 mg by mouth daily.    [provider]  omeprazole (PRILOSEC) 40 MG capsule Take 40 mg by mouth every evening.  07/28/10   [provider]  oxybutynin (DITROPAN XL) 15 MG 24 hr tablet Take 15 mg by mouth at bedtime. 08/22/18   [provider]  oxyCODONE-acetaminophen (PERCOCET/ROXICET) 5-325 MG tablet Take 1 tablet by mouth every 4 (four) hours as needed. 12/07/21   Tegeler,  Canary Brim, MD  Zinc 50 MG TABS Take 50 mg by mouth daily.    [provider]      Allergies    Cortisone, Ivp dye [iodinated contrast media], and Zofran    Review of Systems   Review of Systems  Constitutional:  Negative for fever.  Eyes:  Negative for visual disturbance.  Respiratory:  Negative for shortness of breath.   Cardiovascular:  Negative for chest pain.  Gastrointestinal:  Negative for abdominal pain, diarrhea and vomiting.  Skin:  Negative for rash.  Neurological:  Positive for light-headedness, numbness and headaches. Negative for facial asymmetry and speech difficulty.    Physical Exam Updated Vital Signs BP 111/80 (BP Location: Left Arm)   Pulse 67   Temp 98.1 F (36.7 C)   Resp 18   Wt 86.2 kg   SpO2 96%   BMI 33.66 kg/m  Physical Exam Vitals and nursing note reviewed.  Constitutional:      Appearance: Normal appearance.  HENT:     Head: Normocephalic.     Mouth/Throat:     Mouth: Mucous membranes are moist.  Eyes:     Extraocular Movements: Extraocular movements intact.     Pupils: Pupils are equal, round, and reactive to light.  Neck:     Comments: Tenderness to palpation of the left trapezius muscle but no midline or paracervical tenderness to palpation Cardiovascular:     Rate and Rhythm: Normal rate.  Pulmonary:  Effort: Pulmonary effort is normal. No respiratory distress.  Abdominal:     Palpations: Abdomen is soft.     Tenderness: There is no abdominal tenderness.  Musculoskeletal:     Cervical back: No rigidity.     Comments: Pelvis is stable but tenderness to palpation of the left hip, upper thigh and left calf  Skin:    General: Skin is warm.  Neurological:     Mental Status: She is alert and oriented to person, place, and time.     Comments: Decreased sensation to the left forehead, cheek and chin/corner of the mouth.  Tongue is midline, face is symmetric, speech is baseline, no extremity weakness/drift  Psychiatric:         Mood and Affect: Mood normal.     ED Results / Procedures / Treatments   Labs (all labs ordered are listed, but only abnormal results are displayed) Labs Reviewed  CBC WITH DIFFERENTIAL/PLATELET  BASIC METABOLIC PANEL    EKG None  Radiology No results found.  Procedures .Critical Care  Performed by: Rozelle Logan, DO Authorized by: Rozelle Logan, DO   Critical care provider statement:    Critical care time (minutes):  30   Critical care time was exclusive of:  Separately billable procedures and treating other patients   Critical care was necessary to treat or prevent imminent or life-threatening deterioration of the following conditions:  CNS failure or compromise   Critical care was time spent personally by me on the following activities:  Development of treatment plan with patient or surrogate, discussions with consultants, evaluation of patient's response to treatment, examination of patient, ordering and review of laboratory studies, ordering and review of radiographic studies, ordering and performing treatments and interventions, pulse oximetry, re-evaluation of patient's condition and review of old charts   I assumed direction of critical care for this patient from another provider in my specialty: no     Care discussed with: admitting provider       Medications Ordered in ED Medications - No data to display  ED Course/ Medical Decision Making/ A&P                                 Medical Decision Making Amount and/or Complexity of Data Reviewed Labs: ordered. Radiology: ordered.  Risk Prescription drug management. Decision regarding hospitalization.   68 year old female comes to the emergency department after a fall and head injury.  She quickly developed a posterior headache and on arrival developed sudden onset left-sided facial numbness.  She otherwise appears neuro intact outside of decree sensation to left side of the face but given the  sudden onset nature however related to trauma a code stroke was placed.  NIH of 2 for numbness in the left side of the face and some involving the left arm with weakness in the left lower extremity that seems to be related to the fall.  CT of the head was unremarkable.  Spoke with Dr. Otelia Limes, neurology.  He recommends MRI of the brain, MRA of the brain without contrast.  They had to abort the MRI of the brain halfway through the procedure as the patient became claustrophobic.  After she was returned to the room to give an IV dose of Ativan she is able to complete this study without issue.  No acute findings on the MRI imaging.  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  On reevaluation she continues to have a left-sided face numbness.  She is also having soreness in the left hip and leg from the fall and I will add on x-rays in regards to this.  Otherwise blood work is reassuring.  Patients evaluation and results requires admission for further treatment and care.  Spoke with hospitalist, reviewed patient's ED course and they accept admission.  Patient agrees with admission plan, offers no new complaints and is stable/unchanged at time of admit.        Final Clinical Impression(s) / ED Diagnoses Final diagnoses:  None    Rx / DC Orders ED Discharge Orders     None         Rozelle Logan, DO 03/30/23 1506

## 2023-03-30 NOTE — ED Notes (Signed)
 1108 code stroke called

## 2023-03-30 NOTE — ED Notes (Signed)
 No TNA, decision made.need MRI

## 2023-03-30 NOTE — Progress Notes (Signed)
 20 YOF w/ prior history of HTN and obesity coming in for TIA workup.   Was seen at [facility] today. Initially evaluated for trauma. + head trauma per report. Had fall out of chair at home . In ER, had acute L sided facial and bilateral upper extremity numbness. Was evaluated by Dr. Otelia Limes. Had stroke eval including CT head code stroke, MRI/MRA grossly stable. + LLE drift on exam and L sided facial numbness.  Recommendations are for TTE, carotid ultrasound and further TIA eval Neuro recommendations in chart.  Obs, med-tele bed ordered.

## 2023-03-30 NOTE — ED Notes (Signed)
Tele neuro assessing pt

## 2023-03-30 NOTE — ED Notes (Signed)
 Pt states symptoms started at 1015 , left lip,left side face "weird," kindof. Pt now states her whole left side feels different,slightly decreased

## 2023-03-30 NOTE — ED Notes (Signed)
 Transport to MRI

## 2023-03-30 NOTE — Progress Notes (Signed)
 Telestroke Note   1108: Code stroke cart activated for patient who presents to the ED after experiencing a fall and hitting her head after her chair broke. Patient fell at 0930. Per patient, she developed some numbness present to her lips on the left side. mRS 1. Patient states she takes ASA daily but does not take blood thinners.   1110: Dr.Lindzen [facility] paged.   1111: Patient in CT.   1114: Dr.Lindzen on camera. Patient history and report provided to Dr.Lindzen on camera with patient on hold.   1117: Patient returned from CT.   1118: Dr.Lindzen speaking with patient. Per patient, she developed a headache after hitting her head along with vision changes. Patient states she is seeing squiggly lines in her visual fields. Patient now states at 1015 she noted lip numbness, facial numbness, and left arm numbness.   1124: Dr.Lindzen performing neuro exam.   1132: Dr.Lindzen discussing Code Stroke CT imaging results with patient.   1136: Dr.Lindzen speaking with Dr.Horton and providing recommendations.   1143: No further needs from telestroke nurse per Dr.Lindzen. Logged off telestroke cart at this time.     Derrill Kay Telestroke RN

## 2023-03-30 NOTE — ED Notes (Signed)
 Was trying to sit on a chair to get in the lower cabinet. Patient is unable to bend down all the way. Patient placed stool to sit on and it came out from underneath her hitting the back of you her head twice, and left leg from hip to ankle is hurting.

## 2023-03-30 NOTE — Consult Note (Signed)
 [facility] TeleNeurology Consult Services    Date of Service:  03/30/2023     Metrics: Last Known Well: 10:15 AM (time of symptom onset) Symptoms: As per HPI.  Patient is not a candidate for thrombolytic: Symptoms too mild to treat.   Location of the provider: [facility]  Location of the patient: [facility] Pre-Morbid Modified Rankin Scale: 0 Time Code Stroke Page received:  11:11 AM Time neurologist arrived:  11:14 AM Time NIHSS completed: 11:35 AM    This consult was provided via telemedicine with 2-way video and audio communication. The patient/family was informed that care would be provided in this way and agreed to receive care in this manner.   ED Physician notified of diagnostic impression and management plan at: 11:37 AM   Assessment: 68 year old female presenting with left sided numbness, perioral tingling, and left parietal headache after falling and striking her head - Exam reveals LLE drift and mild sensory disturbance involving her left face, arm and leg.  - CT head: No acute cortically based infarct or acute intracranial hemorrhage identified. ASPECTS 10. Moderately advanced cerebral white matter disease, nonspecific but most commonly due to small vessel ischemia. No acute traumatic injury identified.  - Uncertain if her contrast allergy is to iodinated contrast or to gadolinium. Listed in Epic as iodinated contrast, but patient states that her reaction occurred in the past when she was administered an IV injection for an MRI scan.  - DDx for presentation includes a small right hemisphere lacunar infarction versus stress-related symptoms secondary to the fall, versus postconcussive syndrome, versus a possible complicated migraine triggered by striking her head.    Recommendations: - MRI brain and MRA of head.  - Transfer to [facility] for remainder of CVA work up, which cannot be performed at [facility]: TTE, carotid ultrasound and cardiac  telemetry. - HgbA1c, fasting lipid panel - PT consult, OT consult, Speech consult - Start ASA 81 mg po qd - Await completion of stroke work up for decision on statin - Permissive HTN x 24 hours  - Risk factor modification - Frequent neuro checks - NPO until passes stroke swallow screen - Headache management per ED Team      ------------------------------------------------------------------------------   History of Present Illness: The patient is a 68 year old female who presented to the [facility] ED this morning after she fell from a seated position onto the floor of her kitchen, striking the left posterior aspect of her head without LOC. The fall was due to the chair breaking. When she arrived at the ED, she was complaining of posterior head pain and pain in her LLE extending from her hip down. About 1 hour after arrival, the patient called her nurse to report that she was having "weird tingling feeling to left side of face".  Code Stroke was called and STAT CT head was ordered.   During interview by Teleneurology, the patient states that she has a 7/10 nonthrobbing headache involving the left parietal region of her head. She also has perioral numbness and tingling. She is seeing squiggly lines in front of her, similar to what she experienced during a migraine headache many years ago. She had nausea as well as diarrhea after striking her head. She denies being on a blood thinner. She occasionally takes ASA.      Past Medical History: Past Medical History:  Diagnosis Date   Abdominal pain    Chest pain 09/12/2014   GERD (gastroesophageal reflux disease)  Hernia    Morbid obesity (HCC)    Nausea    Palpitations    Renal disorder    left nephrectomy   UTI (urinary tract infection)    history of them      Past Surgical History: Past Surgical History:  Procedure Laterality Date   ABDOMINAL HYSTERECTOMY  01/2003   BLADDER SUSPENSION  11/2002   BREATH TEK H PYLORI N/A 07/30/2014    Procedure: BREATH TEK H PYLORI;  Surgeon: Glenna Fellows, MD;  Location: [facility];  Service: General;  Laterality: N/A;   CESAREAN SECTION     COLONOSCOPY N/A 07/31/2015   Procedure: COLONOSCOPY;  Surgeon: Jeani Hawking, MD;  Location: [facility];  Service: Endoscopy;  Laterality: N/A;   GLAUCOMA SURGERY     HERNIA REPAIR     LAPAROSCOPIC CHOLECYSTECTOMY  11/01/10   Dr Biagio Quint   removal of kidney  2008   left   TRIGGER FINGER RELEASE Right 09/28/2020   Procedure: RELEASE TRIGGER FINGER/A-1 PULLEY RIGHT LONG FINGER;  Surgeon: Betha Loa, MD;  Location: [facility];  Service: Orthopedics;  Laterality: Right;  MAC with bier block   UMBILICAL HERNIA REPAIR  11/01/10   Dr Biagio Quint, no mesh      Medications:  No current facility-administered medications on file prior to encounter.   Current Outpatient Medications on File Prior to Encounter  Medication Sig Dispense Refill   albuterol (VENTOLIN HFA) 108 (90 Base) MCG/ACT inhaler Inhale 2 puffs into the lungs every 6 (six) hours as needed for wheezing or shortness of breath.     ascorbic acid (VITAMIN C) 500 MG tablet Take 500 mg by mouth daily.     omeprazole (PRILOSEC) 40 MG capsule Take 40 mg by mouth every evening.      oxybutynin (DITROPAN XL) 15 MG 24 hr tablet Take 15 mg by mouth at bedtime.     oxyCODONE-acetaminophen (PERCOCET/ROXICET) 5-325 MG tablet Take 1 tablet by mouth every 4 (four) hours as needed. 20 tablet 0   Zinc 50 MG TABS Take 50 mg by mouth daily.          Social History: No EtOH use Former smoker    Family History:  Reviewed in Epic   ROS: As per HPI    Anticoagulant use:  None   Antiplatelet use: Occasional use of ASA   Examination:    BP 111/80 (BP Location: Left Arm)   Pulse 67   Temp 98.1 F (36.7 C)   Resp 18   Wt 86.2 kg   SpO2 96%   BMI 33.66 kg/m     1A: Level of Consciousness - 0 1B: Ask Month and Age - 0 1C: Blink Eyes & Squeeze Hands - 0 2: Test Horizontal  Extraocular Movements - 0 3: Test Visual Fields - 0 4: Test Facial Palsy (Use Grimace if Obtunded) - 0 5A: Test Left Arm Motor Drift - 0 5B: Test Right Arm Motor Drift - 0 6A: Test Left Leg Motor Drift - 1 6B: Test Right Leg Motor Drift - 0 7: Test Limb Ataxia (FNF/Heel-Shin) - 0 8: Test Sensation -  1 (Left face, arm and leg) 9: Test Language/Aphasia - 0 10: Test Dysarthria - 0 11: Test Extinction/Inattention - 0   NIHSS Score: 2     Patient/Family was informed the Neurology Consult would occur via TeleHealth consult by way of interactive audio and video telecommunications and consented to receiving care in this manner.   Patient is being evaluated for  possible acute neurologic impairment and high pretest probability of imminent or life-threatening deterioration. I spent total of 40 minutes providing care to this patient, including time for face to face visit via telemedicine, review of medical records, imaging studies and discussion of findings with providers, the patient and/or family.   Electronically signed: Dr. Caryl Pina

## 2023-03-30 NOTE — ED Notes (Signed)
 Pt called and stated she has some "weird ,tingling feeling to left side of face." MD aware, in room now. States it happened about 15 minutes ago.

## 2023-03-30 NOTE — Progress Notes (Signed)
 Arrived to [facility].  Vital signs obtained and placed on cardiac monitor.     03/30/23 1620  Vitals  Temp 97.8 F (36.6 C)  Temp Source Oral  BP 125/76  MAP (mmHg) 92  BP Location Right Arm  BP Method Automatic  Patient Position (if appropriate) Lying  Pulse Rate 63  Pulse Rate Source Dinamap  Resp 17  Level of Consciousness  Level of Consciousness Alert  MEWS COLOR  MEWS Score Color Green  Oxygen Therapy  SpO2 98 %  O2 Device Room Air  Pain Assessment  Pain Scale 0-10  Pain Score 0  MEWS Score  MEWS Temp 0  MEWS Systolic 0  MEWS Pulse 0  MEWS RR 0  MEWS LOC 0  MEWS Score 0

## 2023-03-30 NOTE — Care Management (Signed)
 Transition of Care [facility]) - Inpatient Brief Assessment   Patient Details  Name: EDITHE DOBBIN MRN: [id number] Date of Birth: [birth year]  Transition of Care [facility]) CM/SW Contact:    Lockie Pares, RN Phone Number: 03/30/2023, 4:52 PM   Clinical Narrative:  68 year old experienced a fall from a broken chair.  She hit her head and then while in ED began experiencing numbness on one side. MRI/ CT negative for acute issue. Work up ongoing for TIA.  PT consulted, will look for their recommendations tomorrow.   TOC will follow for needs, recommendations,and transitions of care.  Transition of Care Asessment: Insurance and Status: Insurance coverage has been reviewed Patient has primary care physician: Yes Home environment has been reviewed: Single family Prior level of function:: independent Prior/Current Home Services: No current home services Social Drivers of Health Review: SDOH reviewed no interventions necessary Readmission risk has been reviewed: Yes Transition of care needs: transition of care needs identified, TOC will continue to follow

## 2023-03-30 NOTE — ED Triage Notes (Signed)
 Pt c/o mechanical fall after chair that pt was sitting in broke. Pt c/o posterior head pain, and LT side hip to LLE pain. Pt denies neck pain, denies thinners

## 2023-03-30 NOTE — Plan of Care (Signed)
 Admitted from [facility] ED. Alert and oriented NIHSS 2.   Problem: Ischemic Stroke/TIA Tissue Perfusion: Goal: Complications of ischemic stroke/TIA will be minimized Outcome: Progressing  Problem: Coping: Goal: Will verbalize positive feelings about self Outcome: Progressing Goal: Will identify appropriate support needs Outcome: Progressing   Problem: Health Behavior/Discharge Planning: Goal: Ability to manage health-related needs will improve Outcome: Progressing Goal: Goals will be collaboratively established with patient/family Outcome: Progressing   Problem: Self-Care: Goal: Ability to participate in self-care as condition permits will improve Outcome: Progressing

## 2023-03-30 NOTE — H&P (Signed)
 History and Physical    Patient: Marisa Ryan ZOX:[id number] DOB: July 10, 1955 DOA: 03/30/2023 DOS: the patient was seen and examined on 03/30/2023 PCP: Barbie Banner, MD  Patient coming from: Transfer from   Chief Complaint:  Chief Complaint  Patient presents with   Fall   HPI: Marisa Ryan is a 68 y.o. female with medical history significant of  s/p left nephrectomy , GERD, and obesity presents with head and leg pain following a fall. She is accompanied by her husband.  She fell off a chair this morning when it broke from under her while she was trying to access her lower kitchen cabinets. During the fall, she hit her head twice on the floor, primarily on the left side, and experienced pain in her head.  She describes the leg pain as affecting her whole left side and notes that lifting the leg causes some pain. No weakness on one side versus the other, although her whole left side hurts.  While in the emergency room, she experienced numbness and tingling on the left side of her lips, which persists slightly. No numbness or tingling in her arms or legs.  She mentions intermittent chest pain that occurs occasionally, sometimes after physical activity when she sits down. She took a baby aspirin last night due to chest pain, which was before the fall. She describes the chest pain as 'very uncomfortable' but denies that it takes her breath away. She occasionally feels her heart racing and recalls past EKGs showing some irregularities.  She has a family history of heart issues, with her father having had a heart attack in his early forties and her mother having undergone major heart surgery.  She mentions having only one kidney and regularly checks her kidney function. Her thyroid levels are consistently at the high end of normal.   At [facility] patient was seen as a code stroke. noted to be afebrile with blood pressures elevated up to 155/101.   CT manage no acute infarct or intracranial  hemorrhage.  Exam revealed left lower extremity drift and mild sensory disturbance of the left side of the face arm and leg.  Labs noted calcium mildly low at 8.7, and CBC with differential along with remainder of BMP within normal limits.  MRI and MRI of the head did not note any acute signs for stroke.  Review of Systems: As mentioned in the history of present illness. All other systems reviewed and are negative. Past Medical History:  Diagnosis Date   Abdominal pain    Chest pain 09/12/2014   GERD (gastroesophageal reflux disease)    Hernia    Morbid obesity (HCC)    Nausea    Palpitations    Renal disorder    left nephrectomy   UTI (urinary tract infection)    history of them   Past Surgical History:  Procedure Laterality Date   ABDOMINAL HYSTERECTOMY  01/2003   BLADDER SUSPENSION  11/2002   BREATH TEK H PYLORI N/A 07/30/2014   Procedure: BREATH TEK H PYLORI;  Surgeon: Glenna Fellows, MD;  Location: [facility];  Service: General;  Laterality: N/A;   CESAREAN SECTION     COLONOSCOPY N/A 07/31/2015   Procedure: COLONOSCOPY;  Surgeon: Jeani Hawking, MD;  Location: [facility];  Service: Endoscopy;  Laterality: N/A;   GLAUCOMA SURGERY     HERNIA REPAIR     LAPAROSCOPIC CHOLECYSTECTOMY  11/01/10   Dr Biagio Quint   removal of kidney  2008   left   TRIGGER  FINGER RELEASE Right 09/28/2020   Procedure: RELEASE TRIGGER FINGER/A-1 PULLEY RIGHT LONG FINGER;  Surgeon: Betha Loa, MD;  Location: [facility];  Service: Orthopedics;  Laterality: Right;  MAC with bier block   UMBILICAL HERNIA REPAIR  11/01/10   Dr Biagio Quint, no mesh   Social History:  reports that she quit smoking about 31 years ago. Her smoking use included cigarettes. She has never used smokeless tobacco. She reports that she does not drink alcohol and does not use drugs.  Allergies  Allergen Reactions   Cortisone Anaphylaxis and Shortness Of Breath    Felt hot. Turned face red.   Ivp Dye [Iodinated Contrast  Media] Anaphylaxis   Zofran Swelling    Family History  Problem Relation Age of Onset   Cancer Mother        breast   Heart disease Mother 52       AOTRA REPAIR   Stroke Mother    Breast cancer Mother 30   Heart attack Father 10   Hypertension Brother    Stroke Maternal Aunt    Stroke Maternal Grandmother     Prior to Admission medications   Medication Sig Start Date End Date Taking? Authorizing Provider  aspirin EC 81 MG tablet Take 81 mg by mouth daily. Swallow whole.   Yes [provider]  omeprazole (PRILOSEC) 40 MG capsule Take 40 mg by mouth every evening.  07/28/10  Yes [provider]  oxybutynin (DITROPAN XL) 15 MG 24 hr tablet Take 15 mg by mouth at bedtime. 08/22/18  Yes [provider]  oxyCODONE-acetaminophen (PERCOCET/ROXICET) 5-325 MG tablet Take 1 tablet by mouth every 4 (four) hours as needed. Patient not taking: Reported on 03/30/2023 12/07/21   Tegeler, Canary Brim, MD    Physical Exam: Vitals:   03/30/23 0946 03/30/23 1145 03/30/23 1200 03/30/23 1400  BP:  (!) 155/101 (!) 132/100 121/79  Pulse:  64 60 64  Resp:  12 (!) 21 16  Temp:    (!) 97.5 F (36.4 C)  TempSrc:    Oral  SpO2:  97% 95% 97%  Weight: 86.2 kg      Constitutional: Elderly female NAD, calm, comfortable Eyes: PERRL, lids and conjunctivae normal ENMT: Mucous membranes are moist. Normal dentition.  Neck: normal, supple Respiratory: clear to auscultation bilaterally, no wheezing, no crackles. Normal respiratory effort. No accessory muscle use.  Cardiovascular: Regular rate and rhythm with murmur present.  No extremity edema. 2+ pedal pulses.    Abdomen: no tenderness, no masses palpated.  Bowel sounds positive.  Musculoskeletal: no clubbing / cyanosis.  Tenderness to palpation of the left hip and lateral aspect of the leg without acute deformity.  Patient does report some pain with holding left leg up. Skin: no rashes, lesions, ulcers. No induration Neurologic:  CN 2-12 grossly intact.  Patient still reports abnormal sensation around the left side of lip/face.  Strength appears to be 5/5 in all extremities. Psychiatric: Normal judgment and insight. Alert and oriented x 3. Normal mood.   Data Reviewed:  EKG reveals this rhythm at 63 bpm with minimal ST elevations in the inferior leads.  Assessment and Plan:  Left-sided face numbness Acute.  Patient reports having left-sided numbness following her fall this morning.  CT of the head did not note any acute abnormality.  Patient was not a candidate for thrombolytics due to symptoms being mild.  Subsequent MRI with MRA of the head did not note any acute abnormalities.   -Admit to  a telemetry bed -Neurochecks -Check lipid panel and hemoglobin A1c -Check carotid Doppler ultrasound -Check echocardiogram -PT/OT/speech to evaluate and treat -Aspirin -Appreciate neurology consultative services, will follow-up for any further recommendations  Head contusion and left leg pain due to fall Acute.  Patient reports that she was sitting in a broken chair when it broke underneath her causing her to fall backwards hitting her head on the floor.  Imaging of the head did not note any acute abnormality.  X-rays of the hip, left knee, and left ankle did not note any fractures.   -Up with assistance -Hydrocodone as needed for pain  Chest pain Palpitations Patient does report having intermittent chest discomfort and palpitations prior to today.  EKG reveals no significant ischemic changes.  Records note prior nuclear stress back in 12/2016 which was noted to be low risk.  She does report family history significant for heart disease. -Check TSH and cardiac troponin -Follow-up lipid panel and echocardiogram -Follow-up telemetry overnight -Consider need of workup based off findings  Hypocalcemia Acute.  Calcium noted to be mildly low at 8.7.  Abnormal calcium levels can cause paresthesias. -Give 1 g of calcium  gluconate IV -Check magnesium level -Continue to monitor and replace as needed  Elevated blood pressure Patient's blood pressures range from 111/80 to 155/101.  Patient is not on any blood pressure medications. 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  Pathology did not note any signs for malignancy.  GERD -Continue pharmacy substitution of Protonix  Obesity BMI 33.66 kg/m  DVT prophylaxis: Lovenox  Advance Care Planning:   Code Status: Full Code   Consults: Neurology  Family Communication: Husband updated at bedside  Severity of Illness: The appropriate patient status for this patient is OBSERVATION. Observation status is judged to be reasonable and necessary in order to provide the required intensity of service to ensure the patient's safety. The patient's presenting symptoms, physical exam findings, and initial radiographic and laboratory data in the context of their medical condition is felt to place them at decreased risk for further clinical deterioration. Furthermore, it is anticipated that the patient will be medically stable for discharge from the hospital within 2 midnights of admission.   Author: Clydie Braun, MD 03/30/2023 5:04 PM  For on call review [email or web address].

## 2023-03-30 NOTE — ED Notes (Signed)
 1110 transport to ct

## 2023-03-31 ENCOUNTER — Other Ambulatory Visit (HOSPITAL_BASED_OUTPATIENT_CLINIC_OR_DEPARTMENT_OTHER): Payer: Self-pay

## 2023-03-31 ENCOUNTER — Observation Stay (HOSPITAL_COMMUNITY): Payer: Medicare Other

## 2023-03-31 ENCOUNTER — Observation Stay (HOSPITAL_BASED_OUTPATIENT_CLINIC_OR_DEPARTMENT_OTHER): Payer: Medicare Other

## 2023-03-31 DIAGNOSIS — G44309 Post-traumatic headache, unspecified, not intractable: Secondary | ICD-10-CM

## 2023-03-31 DIAGNOSIS — I10 Essential (primary) hypertension: Secondary | ICD-10-CM | POA: Diagnosis not present

## 2023-03-31 DIAGNOSIS — R079 Chest pain, unspecified: Secondary | ICD-10-CM | POA: Diagnosis not present

## 2023-03-31 DIAGNOSIS — R200 Anesthesia of skin: Secondary | ICD-10-CM | POA: Diagnosis not present

## 2023-03-31 DIAGNOSIS — G459 Transient cerebral ischemic attack, unspecified: Secondary | ICD-10-CM

## 2023-03-31 DIAGNOSIS — S0003XA Contusion of scalp, initial encounter: Secondary | ICD-10-CM | POA: Diagnosis not present

## 2023-03-31 DIAGNOSIS — S060X0A Concussion without loss of consciousness, initial encounter: Secondary | ICD-10-CM

## 2023-03-31 DIAGNOSIS — W010XXA Fall on same level from slipping, tripping and stumbling without subsequent striking against object, initial encounter: Secondary | ICD-10-CM | POA: Diagnosis not present

## 2023-03-31 DIAGNOSIS — W19XXXA Unspecified fall, initial encounter: Secondary | ICD-10-CM | POA: Diagnosis not present

## 2023-03-31 LAB — ECHOCARDIOGRAM COMPLETE
Area-P 1/2: 2.77 cm2
Height: 63 in
S' Lateral: 2.9 cm
Weight: 3040.58 [oz_av]

## 2023-03-31 LAB — COMPREHENSIVE METABOLIC PANEL
ALT: 17 U/L (ref 0–44)
AST: 19 U/L (ref 15–41)
Albumin: 3.8 g/dL (ref 3.5–5.0)
Alkaline Phosphatase: 78 U/L (ref 38–126)
Anion gap: 11 (ref 5–15)
BUN: 17 mg/dL (ref 8–23)
CO2: 22 mmol/L (ref 22–32)
Calcium: 9.4 mg/dL (ref 8.9–10.3)
Chloride: 105 mmol/L (ref 98–111)
Creatinine, Ser: 0.88 mg/dL (ref 0.44–1.00)
GFR, Estimated: >60 mL/min (ref >60)
Glucose, Bld: 98 mg/dL (ref 70–99)
Potassium: 4 mmol/L (ref 3.5–5.1)
Sodium: 138 mmol/L (ref 135–145)
Total Bilirubin: 0.6 mg/dL (ref 0.0–1.2)
Total Protein: 7 g/dL (ref 6.5–8.1)

## 2023-03-31 LAB — CBC WITH DIFFERENTIAL/PLATELET
Abs Immature Granulocytes: 0.01 K/uL (ref 0.00–0.07)
Basophils Absolute: 0 K/uL (ref 0.0–0.1)
Basophils Relative: 1 %
Eosinophils Absolute: 0.1 K/uL (ref 0.0–0.5)
Eosinophils Relative: 3 %
HCT: 41.1 % (ref 36.0–46.0)
Hemoglobin: 14.3 g/dL (ref 12.0–15.0)
Immature Granulocytes: 0 %
Lymphocytes Relative: 30 %
Lymphs Abs: 1.6 K/uL (ref 0.7–4.0)
MCH: 29.9 pg (ref 26.0–34.0)
MCHC: 34.8 g/dL (ref 30.0–36.0)
MCV: 86 fL (ref 80.0–100.0)
Monocytes Absolute: 0.4 K/uL (ref 0.1–1.0)
Monocytes Relative: 8 %
Neutro Abs: 3 K/uL (ref 1.7–7.7)
Neutrophils Relative %: 58 %
Platelets: 218 K/uL (ref 150–400)
RBC: 4.78 MIL/uL (ref 3.87–5.11)
RDW: 12.7 % (ref 11.5–15.5)
WBC: 5.1 K/uL (ref 4.0–10.5)
nRBC: 0 % (ref 0.0–0.2)

## 2023-03-31 LAB — PHOSPHORUS: Phosphorus: 4.5 mg/dL (ref 2.5–4.6)

## 2023-03-31 LAB — MAGNESIUM: Magnesium: 2 mg/dL (ref 1.7–2.4)

## 2023-03-31 MED ORDER — SENNOSIDES-DOCUSATE SODIUM 8.6-50 MG PO TABS
1.0000 | ORAL_TABLET | Freq: Every evening | ORAL | 0 refills | Status: AC | PRN
Start: 2023-03-31 — End: ?
  Filled 2023-03-31 (×2): qty 30, 30d supply, fill #0

## 2023-03-31 MED ORDER — ACETAMINOPHEN 325 MG PO TABS
650.0000 mg | ORAL_TABLET | ORAL | 0 refills | Status: AC | PRN
  Filled 2023-03-31 (×2): qty 20, 2d supply, fill #0

## 2023-03-31 NOTE — Evaluation (Signed)
 Occupational Therapy Evaluation Patient Details Name: Marisa Ryan MRN: [id number] DOB: 1955-09-29 Today's Date: 03/31/2023   History of Present Illness   68 y.o. female presents 2/20 with head and L leg pain following a fall, L-sided numbness  BP elevated up to 155/101;  X-rays of the hip, left knee, and left ankle did not note any fractures; MRI of head (-) for acute stroke. PMH: s/p left nephrectomy , GERD, and obesity     Clinical Impressions PTA pt lives independently with her husband, drives and enjoys visiting her daughter/family in [place]. Pt continues to complain of mild numbness L arm/leg and feeling that her arm is "heavy". Complains to slight blurred vision and seeing "squiggly lines". Pt close to baseline functional status. No further OT needed however recommend pt follow up with her eye doctor - pt verbalized understanding. Educated pt/husband on signs/symptoms of CVA using [employer]. OT signing off.      If plan is discharge home, recommend the following:   Assistance with cooking/housework;Assist for transportation     Functional Status Assessment   Patient has not had a recent decline in their functional status     Equipment Recommendations   None recommended by OT     Recommendations for Other Services         Precautions/Restrictions   Precautions Precautions: Fall     Mobility Bed Mobility Overal bed mobility: Independent                  Transfers Overall transfer level: Independent                        Balance Overall balance assessment: Mild deficits observed, not formally tested                                         ADL either performed or assessed with clinical judgement   ADL Overall ADL's : At baseline                                       General ADL Comments: educated on strategies to reduce risk of falls     Vision Baseline Vision/History: 1 Wears glasses Ability  to See in Adequate Light: 0 Adequate Patient Visual Report: Other (comment) (blurring of vision; "squiggly lines") Vision Assessment?: Yes;Wears glasses for reading Eye Alignment: Within Functional Limits Ocular Range of Motion: Within Functional Limits Alignment/Gaze Preference: Within Defined Limits Tracking/Visual Pursuits: Able to track stimulus in all quads without difficulty Saccades: Within functional limits Convergence: Within functional limits Visual Fields: No apparent deficits Additional Comments: blurring  "a little on the L"; "squiggly lines"; not interferring with ability to read     Perception Perception: Within Functional Limits       Praxis Praxis: [facility]       Pertinent Vitals/Pain Pain Assessment Pain Assessment: Faces Faces Pain Scale: Hurts a little bit Pain Location: head Pain Descriptors / Indicators: Headache Pain Intervention(s): Limited activity within patient's tolerance     Extremity/Trunk Assessment Upper Extremity Assessment Upper Extremity Assessment: Overall WFL for tasks assessed (complaining of mild numbness however not interferring with function)   Lower Extremity Assessment Lower Extremity Assessment: Defer to PT evaluation   Cervical / Trunk Assessment Cervical / Trunk Assessment: Other exceptions (iincreased body habitus)  Communication Communication Communication: No apparent difficulties   Cognition Arousal: Alert Behavior During Therapy: WFL for tasks assessed/performed Cognition: No apparent impairments                               Following commands: Intact       Cueing  General Comments          Exercises     Shoulder Instructions      Home Living Family/patient expects to be discharged to:: Private residence Living Arrangements: Spouse/significant other;Children Available Help at Discharge: Family;Available 24 hours/day Type of Home: House Home Access: Stairs to enter [employer] of  Steps: 3   Home Layout: One level     Bathroom Shower/Tub: Producer, television/film/video: Standard Bathroom Accessibility: Yes How Accessible: Accessible via walker Home Equipment: Shower seat;Hand held shower head          Prior Functioning/Environment Prior Level of Function : Independent/Modified Independent;Driving                    OT Problem List: Pain   OT Treatment/Interventions:        OT Goals(Current goals can be found in the care plan section)   Acute Rehab OT Goals Patient Stated Goal: to figure out what happended OT Goal Formulation: All assessment and education complete, DC therapy   OT Frequency:       Co-evaluation              AM-PAC OT "6 Clicks" Daily Activity     Outcome Measure Help from another person eating meals?: None Help from another person taking care of personal grooming?: None Help from another person toileting, which includes using toliet, bedpan, or urinal?: None Help from another person bathing (including washing, rinsing, drying)?: None Help from another person to put on and taking off regular upper body clothing?: None Help from another person to put on and taking off regular lower body clothing?: None 6 Click Score: 24   End of Session Nurse Communication: Mobility status  Activity Tolerance: Patient tolerated treatment well Patient left: in bed;with call bell/phone within reach;with family/visitor present  OT Visit Diagnosis: Unsteadiness on feet (R26.81)                Time: 1610-9604 OT Time Calculation (min): 18 min Charges:  OT General Charges $OT Visit: 1 Visit OT Evaluation $OT Eval Low Complexity: 1 Low  Luisa Dago, OT/L   Acute OT Clinical Specialist Acute Rehabilitation Services Pager [phone] Office [phone]   [facility] 03/31/2023, 11:25 AM

## 2023-03-31 NOTE — Progress Notes (Signed)
 PT Cancellation Note  Patient Details Name: Marisa Ryan MRN: [id number] DOB: [birth year]   Cancelled Treatment:    Reason Eval/Treat Not Completed: (P) Patient at procedure or test/unavailable Pt is off floor for ECHO. PT will follow back later for Evaluation.   Dawnielle Christiana B. Beverely Risen PT, DPT Acute Rehabilitation Services Please use secure chat or  Call Office [phone]    [facility]on Alas [facility] 03/31/2023, 8:42 AM
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
## 2023-03-31 NOTE — Progress Notes (Signed)
 STROKE TEAM PROGRESS NOTE   SUBJECTIVE (INTERVAL HISTORY) Her husband is at the bedside.  Overall her condition is rapidly improving. She stated that she fell yesterday hit her head on the left and developed left sided headache and left facial tingling with seeing floaters on the left eye. Stroke work up all negative/    OBJECTIVE Temp:  [97.7 F (36.5 C)-98.6 F (37 C)] 97.7 F (36.5 C) (02/21 0824) Pulse Rate:  [55-63] 55 (02/21 0824) Cardiac Rhythm: Sinus bradycardia (02/21 0744) Resp:  [16-18] 18 (02/21 0824) BP: (98-125)/(53-86) 125/86 (02/21 0824) SpO2:  [95 %-98 %] 98 % (02/21 0824) Weight:  [86.2 kg] 86.2 kg (02/20 1700)  No results for input(s): "GLUCAP" in the last 168 hours. Recent Labs  Lab 03/30/23 1100 03/30/23 1817 03/31/23 0947  NA 141  --  138  K 3.8  --  4.0  CL 110  --  105  CO2 24  --  22  GLUCOSE 85  --  98  BUN 18  --  17  CREATININE 0.71  --  0.88  CALCIUM 8.7*  --  9.4  MG  --  1.9 2.0  PHOS  --   --  4.5   Recent Labs  Lab 03/31/23 0947  AST 19  ALT 17  ALKPHOS 78  BILITOT 0.6  PROT 7.0  ALBUMIN 3.8   Recent Labs  Lab 03/30/23 1100 03/31/23 0947  WBC 5.9 5.1  NEUTROABS 3.9 3.0  HGB 13.1 14.3  HCT 38.4 41.1  MCV 87.7 86.0  PLT 191 218   No results for input(s): "CKTOTAL", "CKMB", "CKMBINDEX", "TROPONINI" in the last 168 hours. Recent Labs    03/30/23 1108  LABPROT 14.0  INR 1.1   No results for input(s): "COLORURINE", "LABSPEC", "PHURINE", "GLUCOSEU", "HGBUR", "BILIRUBINUR", "KETONESUR", "PROTEINUR", "UROBILINOGEN", "NITRITE", "LEUKOCYTESUR" in the last 72 hours.  Invalid input(s): "APPERANCEUR"     Component Value Date/Time   CHOL 137 03/30/2023 1817   TRIG 97 03/30/2023 1817   HDL 35 (L) 03/30/2023 1817   CHOLHDL 3.9 03/30/2023 1817   VLDL 19 03/30/2023 1817   LDLCALC 83 03/30/2023 1817   Lab Results  Component Value Date   HGBA1C 5.1 03/30/2023      Component Value Date/Time   LABOPIA NONE DETECTED 03/30/2023  1108   COCAINSCRNUR NONE DETECTED 03/30/2023 1108   LABBENZ NONE DETECTED 03/30/2023 1108   AMPHETMU NONE DETECTED 03/30/2023 1108   THCU NONE DETECTED 03/30/2023 1108   LABBARB NONE DETECTED 03/30/2023 1108    Recent Labs  Lab 03/30/23 1110  ETH <10    I have personally reviewed the radiological images below and agree with the radiology interpretations.  VAS US CAROTID Result Date: 03/31/2023 Carotid Arterial Duplex Study Patient Name:  LAITYN BENSEN  Date of Exam:   03/31/2023 Medical Rec #: [id number]      Accession #:    [id number] Date of Birth: 21-Oct-1955     Patient Gender: F Patient Age:   68 years Exam Location:  [facility] Procedure:      VAS US CAROTID Referring Phys: Madelyn Flavors --------------------------------------------------------------------------------  Indications:       TIA. Risk Factors:      None. Comparison Study:  None. Performing Technologist: Shona Simpson  Examination Guidelines: A complete evaluation includes B-mode imaging, spectral Doppler, color Doppler, and power Doppler as needed of all accessible portions of each vessel. Bilateral testing is considered an integral part of a complete examination. Limited examinations for reoccurring  indications may be performed as noted.  Right Carotid Findings: +----------+--------+--------+--------+------------------+------------------+           PSV cm/sEDV cm/sStenosisPlaque DescriptionComments           +----------+--------+--------+--------+------------------+------------------+ CCA Prox  84      18                                                   +----------+--------+--------+--------+------------------+------------------+ CCA Distal91      24                                intimal thickening +----------+--------+--------+--------+------------------+------------------+ ICA Prox  73      24                                intimal thickening  +----------+--------+--------+--------+------------------+------------------+ ICA Distal78      28                                                   +----------+--------+--------+--------+------------------+------------------+ ECA       68      9                                                    +----------+--------+--------+--------+------------------+------------------+ +----------+--------+-------+--------+-------------------+           PSV cm/sEDV cmsDescribeArm Pressure (mmHG) +----------+--------+-------+--------+-------------------+ [id number]     0                                  +----------+--------+-------+--------+-------------------+ +---------+--------+--+--------+--+ VertebralPSV cm/s53EDV cm/s18 +---------+--------+--+--------+--+  Left Carotid Findings: +----------+--------+--------+--------+------------------+------------------+           PSV cm/sEDV cm/sStenosisPlaque DescriptionComments           +----------+--------+--------+--------+------------------+------------------+ CCA Prox  119     27                                                   +----------+--------+--------+--------+------------------+------------------+ CCA Distal78      27                                intimal thickening +----------+--------+--------+--------+------------------+------------------+ ICA Prox  78      22      1-39%   heterogenous                         +----------+--------+--------+--------+------------------+------------------+ ICA Distal50      17                                                   +----------+--------+--------+--------+------------------+------------------+  ECA       79      15                                                   +----------+--------+--------+--------+------------------+------------------+ +----------+--------+--------+--------+-------------------+           PSV cm/sEDV cm/sDescribeArm Pressure  (mmHG) +----------+--------+--------+--------+-------------------+ Subclavian103     0                                   +----------+--------+--------+--------+-------------------+ +---------+--------+--+--------+--+ VertebralPSV cm/s46EDV cm/s13 +---------+--------+--+--------+--+   Summary: Right Carotid: The extracranial vessels were near-normal with only minimal wall                thickening or plaque. Left Carotid: Velocities in the left ICA are consistent with a 1-39% stenosis. Vertebrals:  Bilateral vertebral arteries demonstrate antegrade flow. Subclavians: Normal flow hemodynamics were seen in bilateral subclavian              arteries. *See table(s) above for measurements and observations.  Electronically signed by Sherald Hess MD on 03/31/2023 at 2:00:35 PM.    Final    ECHOCARDIOGRAM COMPLETE Result Date: 03/31/2023    ECHOCARDIOGRAM REPORT   Patient Name:   COLENE MINES Date of Exam: 03/31/2023 Medical Rec #:  [id number]     Height:       63.0 in Accession #:    [id number]    Weight:       190.0 lb Date of Birth:  [birth year]    BSA:          1.892 m Patient Age:    67 years      BP:           125/86 mmHg Patient Gender: F             HR:           56 bpm. Exam Location:  Inpatient Procedure: 2D Echo (Both Spectral and Color Flow Doppler were utilized during            procedure). Indications:    stroke  History:        Patient has prior history of Echocardiogram examinations, most                 recent 12/09/2016. Signs/Symptoms:Chest Pain; Risk                 Factors:Hypertension.  Sonographer:    Delcie Roch RDCS Referring Phys: [phone] RONDELL A SMITH IMPRESSIONS  1. Left ventricular ejection fraction, by estimation, is 60 to 65%. The left ventricle has normal function. The left ventricle has no regional wall motion abnormalities. Left ventricular diastolic parameters are consistent with Grade I diastolic dysfunction (impaired relaxation).  2. Right ventricular systolic  function is normal. The right ventricular size is normal.  3. The mitral valve is normal in structure. Trivial mitral valve regurgitation. No evidence of mitral stenosis.  4. The aortic valve is normal in structure. Aortic valve regurgitation is not visualized. No aortic stenosis is present.  5. The inferior vena cava is normal in size with greater than 50% respiratory variability, suggesting right atrial pressure of 3 mmHg. FINDINGS  Left Ventricle: Left ventricular ejection fraction, by estimation, is 60 to 65%. The left ventricle has  normal function. The left ventricle has no regional wall motion abnormalities. Strain imaging was not performed. The left ventricular internal cavity  size was normal in size. There is no left ventricular hypertrophy. Left ventricular diastolic parameters are consistent with Grade I diastolic dysfunction (impaired relaxation). Right Ventricle: The right ventricular size is normal. No increase in right ventricular wall thickness. Right ventricular systolic function is normal. Left Atrium: Left atrial size was normal in size. Right Atrium: Right atrial size was normal in size. Pericardium: There is no evidence of pericardial effusion. Mitral Valve: The mitral valve is normal in structure. Trivial mitral valve regurgitation. No evidence of mitral valve stenosis. Tricuspid Valve: The tricuspid valve is normal in structure. Tricuspid valve regurgitation is trivial. No evidence of tricuspid stenosis. Aortic Valve: The aortic valve is normal in structure. Aortic valve regurgitation is not visualized. No aortic stenosis is present. Pulmonic Valve: The pulmonic valve was normal in structure. Pulmonic valve regurgitation is not visualized. No evidence of pulmonic stenosis. Aorta: The aortic root is normal in size and structure. Venous: The inferior vena cava is normal in size with greater than 50% respiratory variability, suggesting right atrial pressure of 3 mmHg. IAS/Shunts: No atrial level  shunt detected by color flow Doppler. Additional Comments: 3D imaging was not performed.  LEFT VENTRICLE PLAX 2D LVIDd:         4.50 cm   Diastology LVIDs:         2.90 cm   LV e' medial:    3.70 cm/s LV PW:         0.90 cm   LV E/e' medial:  15.5 LV IVS:        0.90 cm   LV e' lateral:   7.83 cm/s LVOT diam:     1.90 cm   LV E/e' lateral: 7.3 LV SV:         62 LV SV Index:   33 LVOT Area:     2.84 cm  RIGHT VENTRICLE             IVC RV Basal diam:  2.60 cm     IVC diam: 1.10 cm RV S prime:     13.20 cm/s TAPSE (M-mode): 2.0 cm LEFT ATRIUM             Index        RIGHT ATRIUM           Index LA Vol (A2C):   37.3 ml 19.71 ml/m  RA Area:     12.80 cm LA Vol (A4C):   36.7 ml 19.40 ml/m  RA Volume:   29.20 ml  15.43 ml/m LA Biplane Vol: 37.4 ml 19.77 ml/m  AORTIC VALVE LVOT Vmax:   106.00 cm/s LVOT Vmean:  64.000 cm/s LVOT VTI:    0.218 m  AORTA Ao Root diam: 2.90 cm Ao Asc diam:  3.50 cm MITRAL VALVE MV Area (PHT): 2.77 cm    SHUNTS MV Decel Time: 274 msec    Systemic VTI:  0.22 m MV E velocity: 57.50 cm/s  Systemic Diam: 1.90 cm MV A velocity: 91.10 cm/s MV E/A ratio:  0.63 Arvilla Meres MD Electronically signed by Arvilla Meres MD Signature Date/Time: 03/31/2023/9:44:08 AM    Final    DG Hip Unilat W or Wo Pelvis 2-3 Views Left Result Date: 03/30/2023 CLINICAL DATA:  Left lower extremity pain after fall. EXAM: DG HIP (WITH OR WITHOUT PELVIS) 2-3V LEFT COMPARISON:  None Available. FINDINGS: No evidence of acute fracture  of the pelvis or left hip. No left hip dislocation. Minor joint space narrowing with acetabular spurring. No erosion or avascular necrosis. Pubic rami are intact. Pubic symphysis and sacroiliac joints are congruent. IMPRESSION: 1. No acute fracture or subluxation of the left hip. 2. Mild left hip osteoarthritis. Electronically Signed   By: Narda Rutherford M.D.   On: 03/30/2023 15:10   DG Knee Complete 4 Views Left Result Date: 03/30/2023 CLINICAL DATA:  Left lower extremity pain  after fall EXAM: LEFT KNEE - COMPLETE 4+ VIEW COMPARISON:  01/22/2015 FINDINGS: No evidence of fracture, dislocation, or joint effusion. The bones are subjectively under mineralized. Chondrocalcinosis. Soft tissues are unremarkable. IMPRESSION: 1. No fracture or subluxation of the left knee. 2. Chondrocalcinosis. Electronically Signed   By: Narda Rutherford M.D.   On: 03/30/2023 15:09   DG Ankle Complete Left Result Date: 03/30/2023 CLINICAL DATA:  Left lower extremity pain after fall. EXAM: LEFT ANKLE COMPLETE - 3+ VIEW COMPARISON:  None Available. FINDINGS: The bones are subjectively under mineralized. There is no evidence of fracture, dislocation, or joint effusion. Plantar calcaneal spur and Achilles tendon enthesophyte. There is no evidence of arthropathy or other focal bone abnormality. Soft tissues are unremarkable. IMPRESSION: 1. No fracture or subluxation of the left ankle. 2. Plantar calcaneal spur and Achilles tendon enthesophyte. Electronically Signed   By: Narda Rutherford M.D.   On: 03/30/2023 15:08   MR BRAIN WO CONTRAST Result Date: 03/30/2023 CLINICAL DATA:  Neuro deficit, acute, stroke suspected EXAM: MRI HEAD WITHOUT CONTRAST TECHNIQUE: Multiplanar, multiecho pulse sequences of the brain and surrounding structures were obtained without intravenous contrast. COMPARISON:  Truncated MRI performed earlier today. CT head from earlier today. FINDINGS: Please note that DWI/ADC and sagittal T1 were not performed given these were performed on scan earlier today. Brain: No evidence of acute infarct on MRI earlier today. Moderate T2/FLAIR hyperintensity in the white matter nonspecific but compatible with chronic microvascular ischemic disease. No evidence of acute hemorrhage, mass lesion, midline shift or hydrocephalus. Vascular: Major arterial flow voids are maintained at the skull base. Skull and upper cervical spine: Normal marrow signal. Sinuses/Orbits: Sinuses are largely clear. No acute orbital  findings. Other: No mastoid effusions. IMPRESSION: Sequences not performed on truncated earlier MRI were done on this exam. No evidence of acute abnormality. Moderate chronic microvascular ischemic disease. Electronically Signed   By: Feliberto Harts M.D.   On: 03/30/2023 13:53   MR ANGIO HEAD WO CONTRAST Result Date: 03/30/2023 CLINICAL DATA:  68 year old female code stroke presentation. Status post fall. Numbness. EXAM: MRA HEAD WITHOUT CONTRAST TECHNIQUE: Angiographic images of the Circle of Willis were acquired using MRA technique without intravenous contrast. COMPARISON:  Head CT, truncated brain MRI today reported separately. FINDINGS: Mildly motion degraded. Anterior circulation: Antegrade flow in both ICA siphons. Tortuous right ICA just below the skull base. Ophthalmic and left posterior communicating artery origins appear normal. Patent carotid termini, MCA and ACA origins. Visible ACA branches are within normal limits. Bilateral MCA M1 segments, left MCA trifurcation, right MCA bifurcation appear patent without stenosis. Visible MCA branches are within normal limits. Posterior circulation: Antegrade flow in the posterior circulation. Distal vertebral arteries appear to be codominant. Vertebrobasilar junction and basilar arteries are patent. Fetal type left PCA origin. SCA and right PCA origins are patent. Bilateral PCA branches are within normal limits. Right posterior communicating artery is diminutive or absent. Anatomic variants: Fetal type left PCA origin. Other: Brain MRI today reported separately. IMPRESSION: Negative intracranial MRA when allowing  for mild motion artifact. Electronically Signed   By: Odessa Fleming M.D.   On: 03/30/2023 12:53   MR BRAIN WO CONTRAST Result Date: 03/30/2023 CLINICAL DATA:  68 year old female code stroke presentation. Status post fall. Numbness. EXAM: MRI HEAD WITHOUT CONTRAST TECHNIQUE: Multiplanar, multiecho pulse sequences of the brain and surrounding structures  were obtained without intravenous contrast. COMPARISON:  Head CT today 1118 hours. FINDINGS: The examination was discontinued prior to completion by patient request. Sagittal T1 imaging, axial and coronal DWI was obtained. No restricted diffusion or evidence of acute infarction. No midline shift, mass effect, evidence of mass lesion, ventriculomegaly. Normal basilar cisterns, cervicomedullary junction and pituitary are within normal limits. Visualized bone marrow signal is within normal limits. Negative for age visible cervical spine. IMPRESSION: Truncated exam, negative for acute infarct. Electronically Signed   By: Odessa Fleming M.D.   On: 03/30/2023 12:50   CT HEAD CODE STROKE WO CONTRAST Addendum Date: 03/30/2023 ADDENDUM REPORT: 03/30/2023 11:35 ADDENDUM: Study discussed by telephone with Dr. Coralee Pesa on 03/30/2023 at 1127 hours. Electronically Signed   By: Odessa Fleming M.D.   On: 03/30/2023 11:35   Result Date: 03/30/2023 CLINICAL DATA:  Code stroke. 68 year old female status post fall. Numbness. EXAM: CT HEAD WITHOUT CONTRAST TECHNIQUE: Contiguous axial images were obtained from the base of the skull through the vertex without intravenous contrast. RADIATION DOSE REDUCTION: This exam was performed according to the departmental dose-optimization program which includes automated exposure control, adjustment of the mA and/or kV according to patient size and/or use of iterative reconstruction technique. COMPARISON:  Head CT 07/21/2013. FINDINGS: Brain: Cerebral volume loss since 2015 appears to be generalized. No midline shift, mass effect, or evidence of intracranial mass lesion. No ventriculomegaly. No acute intracranial hemorrhage identified. Patchy, scattered, moderate for age bilateral cerebral white matter hypodensity is chronic but progressed. Deep white matter capsule involvement. Deep gray nuclei and posterior fossa gray-white differentiation remain within normal limits. No cortically based acute infarct  identified. Vascular: No suspicious intracranial vascular hyperdensity. Skull: Stable since 2015, intact. Sinuses/Orbits: Visualized paranasal sinuses and mastoids are clear. Other: No orbit or scalp soft tissue injury identified. ASPECTS [facility] Stroke Program Early CT Score) Total score (0-10 with 10 being normal): 10 IMPRESSION: 1. No acute cortically based infarct or acute intracranial hemorrhage identified. ASPECTS 10. 2. Moderately advanced cerebral white matter disease, nonspecific but most commonly due to small vessel ischemia. 3.  No acute traumatic injury identified. Electronically Signed: By: Odessa Fleming M.D. On: 03/30/2023 11:23     PHYSICAL EXAM  Temp:  [97.7 F (36.5 C)-98.6 F (37 C)] 97.7 F (36.5 C) (02/21 0824) Pulse Rate:  [55-63] 55 (02/21 0824) Resp:  [16-18] 18 (02/21 0824) BP: (98-125)/(53-86) 125/86 (02/21 0824) SpO2:  [95 %-98 %] 98 % (02/21 0824) Weight:  [86.2 kg] 86.2 kg (02/20 1700)  General - Well nourished, well developed, in no apparent distress.  Ophthalmologic - fundi not visualized due to noncooperation.  Cardiovascular - Regular rhythm and rate.  Mental Status -  Level of arousal and orientation to time, place, and person were intact. Language including expression, naming, repetition, comprehension was assessed and found intact. Fund of Knowledge was assessed and was intact.  Cranial Nerves II - XII - II - Visual field intact OU. III, IV, VI - Extraocular movements intact. V - Facial sensation subtly decreased on the left. VII - Facial movement intact bilaterally. VIII - Hearing & vestibular intact bilaterally. X - Palate elevates symmetrically. XI Claudie Fisherman  turning & shoulder shrug intact bilaterally. XII - Tongue protrusion intact.  Motor Strength - The patient's strength was normal in all extremities and pronator drift was absent.  Bulk was normal and fasciculations were absent.   Motor Tone - Muscle tone was assessed at the neck and appendages  and was normal.  Reflexes - The patient's reflexes were symmetrical in all extremities and she had no pathological reflexes.  Sensory - Light touch, temperature/pinprick were assessed and were subtly decreased on the left.    Coordination - The patient had normal movements in the hands and feet with no ataxia or dysmetria.  Tremor was absent.  Gait and Station - deferred.   ASSESSMENT/PLAN Ms. Chastidy ANAYI BRICCO is a 68 y.o. female with history of anxiety and obesity admitted for mechanical fall, hitting head, left-sided headache and facial numbness. No TNK given due to outside window.    Postconcussion complicated headache Mechanical fall, hitting head followed by left-sided headache and left facial numbness tingling CT no acute finding MRI no acute infarct MRA unremarkable Carotid Doppler unremarkable 2D Echo EF 60 to 65% LDL 83 HgbA1c 5.1 UDS negative Lovenox for VTE prophylaxis aspirin 81 mg daily intermittent prior to admission, now on aspirin 81 mg daily.  Continue on discharge. Ongoing aggressive stroke risk factor management Therapy recommendations: None Disposition: Home  BP measurement Stable Long term BP goal normotensive  Other Stroke Risk Factors Advanced age Obesity, Body mass index is 33.66 kg/m.   Other Active Problems Anxiety  Hospital day # 0  Neurology will sign off. Please call with questions.  No neuro follow-up needed at this time. Thanks for the consult.   Marvel Plan, MD PhD Stroke Neurology 03/31/2023 3:46 PM    To contact Stroke Continuity provider, please refer to [email or web address]. After hours, contact General Neurology

## 2023-03-31 NOTE — Evaluation (Signed)
 Physical Therapy Evaluation Patient Details Name: Marisa Ryan MRN: [id number] DOB: 1955-05-29 Today's Date: 03/31/2023  History of Present Illness  68 y.o. female presents 2/20 with head and L leg pain following a fall, L-sided numbness  BP elevated up to 155/101;  X-rays of the hip, left knee, and left ankle did not note any fractures; MRI of head (-) for acute stroke. PMH: s/p left nephrectomy , GERD, and obesity  Clinical Impression  PTA pt living with husband and daughter in single story home with 3 steps to enter. Pt completely independent, frequently driving to [place] to see her daughter and grandchildren. Pt is currently limited in safe mobility by L LE pain and decreased sensation. Pt is independent with bed mobility and transfers and supervision for ambulation and contact guard for stairs. Pt and husband educated on BE FAST symptomology. PT will continue to follow acutely.       If plan is discharge home, recommend the following: A little help with walking and/or transfers;Help with stairs or ramp for entrance;Assist for transportation   Can travel by private vehicle    Yes    Equipment Recommendations None recommended by PT     Functional Status Assessment Patient has had a recent decline in their functional status and demonstrates the ability to make significant improvements in function in a reasonable and predictable amount of time.     Precautions / Restrictions Precautions Precautions: Fall      Mobility  Bed Mobility Overal bed mobility: Independent                  Transfers Overall transfer level: Independent                      Ambulation/Gait Ambulation/Gait assistance: Supervision Gait Distance (Feet): 100 Feet Assistive device: None Gait Pattern/deviations: Step-through pattern, Decreased stance time - left, Decreased step length - right Gait velocity: slower that baseline Gait velocity interpretation: 1.31 - 2.62 ft/sec,  indicative of limited community ambulator   General Gait Details: pt with decreased weightshift to L due to increased pain from fall  Stairs Stairs: Yes Stairs assistance: Contact guard assist Stair Management: Two rails, Step to pattern, Alternating pattern Number of Stairs: 3 (x2) General stair comments: pt attempts step over step but has increased difficulty with L foot ascent, improved stability with step to pattern leading with RLE up and LLE down.      Balance Overall balance assessment: Mild deficits observed, not formally tested                                           Pertinent Vitals/Pain Pain Assessment Pain Assessment: 0-10 Faces Pain Scale: Hurts a little bit Pain Location: head, L LE with weightbearing Pain Descriptors / Indicators: Headache Pain Intervention(s): Limited activity within patient's tolerance, Monitored during session, Repositioned    Home Living Family/patient expects to be discharged to:: Private residence Living Arrangements: Spouse/significant other;Children Available Help at Discharge: Family;Available 24 hours/day Type of Home: House Home Access: Stairs to enter   [employer] of Steps: 3   Home Layout: One level Home Equipment: Shower seat;Hand held shower head      Prior Function Prior Level of Function : Independent/Modified Independent;Driving                     Extremity/Trunk Assessment  Upper Extremity Assessment Upper Extremity Assessment: Defer to OT evaluation    Lower Extremity Assessment Lower Extremity Assessment: LLE deficits/detail LLE Deficits / Details: L LE ROM WFL, strength 4-/5 (just slightly less than RLE) LLE Sensation: decreased light touch LLE Coordination: decreased fine motor    Cervical / Trunk Assessment Cervical / Trunk Assessment: Other exceptions (iincreased body habitus)  Communication   Communication Communication: No apparent difficulties     Cognition Arousal: Alert Behavior During Therapy: WFL for tasks assessed/performed                             Following commands: Intact       Cueing       General Comments General comments (skin integrity, edema, etc.): VSS on RA, Educated on BE FAST CVA symptomology    Exercises     Assessment/Plan    PT Assessment Patient needs continued PT services  PT Problem List Decreased strength;Decreased balance;Decreased mobility;Impaired sensation;Pain       PT Treatment Interventions DME instruction;Gait training;Stair training;Functional mobility training;Therapeutic activities;Therapeutic exercise;Balance training;Neuromuscular re-education;Cognitive remediation;Patient/family education    PT Goals (Current goals can be found in the Care Plan section)  Acute Rehab PT Goals PT Goal Formulation: With patient/family Time For Goal Achievement: 04/14/23 Potential to Achieve Goals: Good    Frequency Min 1X/week        AM-PAC PT "6 Clicks" Mobility  Outcome Measure Help needed turning from your back to your side while in a flat bed without using bedrails?: None Help needed moving from lying on your back to sitting on the side of a flat bed without using bedrails?: None Help needed moving to and from a bed to a chair (including a wheelchair)?: None Help needed standing up from a chair using your arms (e.g., wheelchair or bedside chair)?: None Help needed to walk in hospital room?: A Little Help needed climbing 3-5 steps with a railing? : A Little 6 Click Score: 22    End of Session Equipment Utilized During Treatment: Gait belt Activity Tolerance: Patient tolerated treatment well Patient left: in bed;with call bell/phone within reach;with family/visitor present Nurse Communication: Mobility status PT Visit Diagnosis: Unsteadiness on feet (R26.81);Muscle weakness (generalized) (M62.81);Other abnormalities of gait and mobility (R26.89);Difficulty in walking, not  elsewhere classified (R26.2);Other symptoms and signs involving the nervous system (R29.898)    Time: 1035-1101 PT Time Calculation (min) (ACUTE ONLY): 26 min   Charges:   PT Evaluation $PT Eval Low Complexity: 1 Low PT Treatments $Gait Training: 8-22 mins PT General Charges $$ ACUTE PT VISIT: 1 Visit         Coltan Spinello B. Beverely Risen PT, DPT Acute Rehabilitation Services Please use secure chat or  Call Office [phone]   [facility] 03/31/2023, 2:19 PM

## 2023-03-31 NOTE — Progress Notes (Signed)
Patient off the unit for  Vascular US.

## 2023-03-31 NOTE — TOC Transition Note (Addendum)
 Transition of Care [facility]) - Discharge Note   Patient Details  Name: Marisa Ryan MRN: [id number] Date of Birth: October 28, 1955  [facility]) CM/SW Contact:  Lockie Pares, RN Phone Number: 03/31/2023, 2:06 PM   Clinical Narrative:     PT OT assessment- OT and speech reveal no needs, patient back to baseline, with a bit of numbness.  PT recommending HH.  Bringing the paitent list of HH agencies in the [place] area. [address] [facility] like [facility] Called for acceptance, they cannot accept due to staffing. Discussed with Ms Sessa, she will call this [facility] tomorrow after speaking with her best friend who is a PT at [employer]. She has a list of HH from [email or web address]. Messaged with provider to place [id number] order  Patient will DC home today Final next level of care: Home/Self Care Barriers to Discharge: No Barriers Identified   Patient Goals and CMS Choice            Discharge Placement             Home with home health          Discharge Plan and Services Additional resources added to the After Visit Summary for                                       Social Drivers of Health (SDOH) Interventions SDOH Screenings   Food Insecurity: No Food Insecurity (03/30/2023)  Housing: Low Risk  (03/30/2023)  Transportation Needs: No Transportation Needs (03/30/2023)  Utilities: Not At Risk (03/30/2023)  Social Connections: Moderately Isolated (03/30/2023)  Tobacco Use: Medium Risk (03/30/2023)     Readmission Risk Interventions     No data to display

## 2023-03-31 NOTE — Progress Notes (Signed)
 Reviewed AVS, patient expressed understanding of medications, MD follow up reviewed.   Removed IV, Site clean, dry and intact.  Patient states all belongings accounted for that were brought to the hospital.  Pt transported to entrance A where family member was waiting in vehicle to transport home.

## 2023-03-31 NOTE — Progress Notes (Signed)
  Echocardiogram 2D Echocardiogram has been performed.  Marisa Ryan 03/31/2023, 9:20 AM

## 2023-03-31 NOTE — Progress Notes (Signed)
Patient back to the unit.

## 2023-03-31 NOTE — Plan of Care (Signed)

## 2023-03-31 NOTE — Progress Notes (Signed)
 Carotid arterial duplex completed. Please see CV Procedures for preliminary results.  Shona Simpson, RVT 03/31/23 9:29 AM

## 2023-04-02 NOTE — Discharge Summary (Signed)
 Physician Discharge Summary   Patient: Marisa Ryan MRN: [id number] DOB: [birth year]  Admit date:     03/30/2023  Discharge date: 03/31/2023  Discharge Physician: Marisa Ryan   PCP: Marisa Banner, MD   Recommendations at discharge:   Follow-up with PCP within 1 to 2 weeks repeat CBC, CMP, mag, Phos within 1 week Follow-up with neurology in outpatient setting and continue as an 81 mg at discharge  Discharge Diagnoses: Principal Problem:   Left facial numbness Active Problems:   Left leg pain   Head contusion   Fall at home, initial encounter   Palpitations   Chest pain   Hypocalcemia   Elevated blood pressure reading   H/O left nephrectomy   GERD (gastroesophageal reflux disease)   Obesity (BMI 30-39.9)  Resolved Problems:   * No resolved hospital problems. [facility] Course: HPI per Dr. Madelyn Flavors  Marisa Ryan is a 68 y.o. female with medical history significant of  s/p left nephrectomy , GERD, and obesity presents with head and leg pain following a fall. She is accompanied by her husband.   She fell off a chair this morning when it broke from under her while she was trying to access her lower kitchen cabinets. During the fall, she hit her head twice on the floor, primarily on the left side, and experienced pain in her head.   She describes the leg pain as affecting her whole left side and notes that lifting the leg causes some pain. No weakness on one side versus the other, although her whole left side hurts.   While in the emergency room, she experienced numbness and tingling on the left side of her lips, which persists slightly. No numbness or tingling in her arms or legs.   She mentions intermittent chest pain that occurs occasionally, sometimes after physical activity when she sits down. She took a baby aspirin last night due to chest pain, which was before the fall. She describes the chest pain as 'very uncomfortable' but denies that it takes her breath  away. She occasionally feels her heart racing and recalls past EKGs showing some irregularities.   She has a family history of heart issues, with her father having had a heart attack in his early forties and her mother having undergone major heart surgery.   She mentions having only one kidney and regularly checks her kidney function. Her thyroid levels are consistently at the high end of normal.    At [facility] patient was seen as a code stroke. noted to be afebrile with blood pressures elevated up to 155/101.   CT manage no acute infarct or intracranial hemorrhage.  Exam revealed left lower extremity drift and mild sensory disturbance of the left side of the face arm and leg.  Labs noted calcium mildly low at 8.7, and CBC with differential along with remainder of BMP within normal limits.  MRI and MRI of the head did not note any acute signs for stroke.  **Interim History Evaluated by neurology and they feel that her symptoms were a postconcussive episode given her fall and hitting her head.  Her left-sided face numbness was improving and neurology does not feel that she had a TIA or stroke and recommending just aspirin 81 mg daily.  They recommended blood pressure management and risk management and she is now stable for discharge given that she is improved and PT OT recommending no follow-up.  Assessment and Plan:  Left-sided face numbness in the setting  of postconcussive headache -Acute.   -Patient reports having left-sided numbness following her fall this morning.  CT of the head did not note any acute abnormality.  Patient was not a candidate for thrombolytics due to symptoms being mild.  -Subsequent MRI with MRA of the head did not note any acute abnormalities.   -Admit to a telemetry bed given that she improved she was changed to observation -Neurochecks -Check lipid panel as below -Check carotid Doppler ultrasound and showed "Right Carotid: The extracranial vessels were near-normal with  only minimal wall              thickening or plaque. Left Carotid: Velocities in the left ICA are consistent with a 1-39% stenosis." -Check Echocardiogram and had a normal EF as below -PT/OT/speech to evaluate and treat -Aspirin 81 mg recommended -Appreciate neurology consultative services, will follow-up for any further recommendations and they do not feel that she had a stroke or TIA and they recommended just continue aspirin 81   Head contusion and left leg pain due to fall -Acute.   -Patient reports that she was sitting in a broken chair when it broke underneath her causing her to fall backwards hitting her head on the floor.   -Imaging of the head did not note any acute abnormality.  X-rays of the hip, left knee, and left ankle did not note any fractures.   -Up with assistance -Hydrocodone as needed for pain -Is improving stable for discharge   Chest pain, improved Palpitations -Patient does report having intermittent chest discomfort and palpitations prior to today.   -EKG reveals no significant ischemic changes.   -Records note prior nuclear stress back in 12/2016 which was noted to be low risk.   -She does report family history significant for heart disease. -Lipid panel done and showed a total cholesterol/HDL ratio 3.9, cholesterol level 137, HDL 35, LDL of 83, triglycerides 97, VLDL 19 -Check TSH was 3.1 on 8 and cardiac troponin x 2 were negative and flat at 3 -Echocardiogram done and showed an EF of 60 to 65% no regional wall motion abnormalities and grade 1 diastolic dysfunction -Follow-up telemetry overnight and was unremarkable -Consider need of workup based off findings and recommending following with cardiology in outpatient setting if necessary   Hypocalcemia Acute.  Calcium noted to be mildly low at 8.7.  Abnormal calcium levels can cause paresthesias. -Give 1 g of calcium gluconate IV -Check magnesium level -Calcium Level Trend: Recent Labs  Lab 03/30/23 1100  03/31/23 0947  CALCIUM 8.7* 9.4  -Continue to monitor and replace as needed   Elevated blood pressure -Patient's blood pressures range from 111/80 to 155/101.  Patient is not on any blood pressure medications. 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  -Pathology did not note any signs for malignancy. -BUN/Cr Trend: Recent Labs  Lab 03/30/23 1100 03/31/23 0947  BUN 18 17  CREATININE 0.71 0.88   -Avoid Nephrotoxic Medications, Contrast Dyes, Hypotension and Dehydration to Ensure Adequate Renal Perfusion and will need to Renally Adjust Meds -Continue to Monitor and Trend Renal Function carefully and repeat CMP in the AM    GERD -Continue pharmacy substitution of Protonix   Class IObesity -Complicates overall prognosis and care -Estimated body mass index is 33.66 kg/m as calculated from the following:   Height as of this encounter: 5\' 3"  (1.6 m).  Weight as of this encounter: 86.2 kg.  -Weight Loss and Dietary Counseling given  Consultants: Neurology Procedures performed: As delineated as above Disposition: Home Diet recommendation:  Discharge Diet Orders (From admission, onward)     Start     Ordered   03/31/23 0000  Diet - low sodium heart healthy        03/31/23 1404           Cardiac and Carb modified diet DISCHARGE MEDICATION: Allergies as of 03/31/2023       Reactions   Cortisone Anaphylaxis, Shortness Of Breath   Felt hot. Turned face red.   Ivp Dye [iodinated Contrast Media] Anaphylaxis   Zofran Swelling        Medication List     STOP taking these medications    oxyCODONE-acetaminophen 5-325 MG tablet Commonly known as: PERCOCET/ROXICET       TAKE these medications    acetaminophen 325 MG tablet Commonly known as: TYLENOL Take 2 tablets (650 mg total) by mouth every 4 (four) hours as  needed for mild pain (pain score 1-3) (or temp > 37.5 C (99.5 F)).   aspirin EC 81 MG tablet Take 81 mg by mouth daily. Swallow whole.   omeprazole 40 MG capsule Commonly known as: PRILOSEC Take 40 mg by mouth every evening.   oxybutynin 15 MG 24 hr tablet Commonly known as: DITROPAN XL Take 15 mg by mouth at bedtime.   senna-docusate 8.6-50 MG tablet Commonly known as: Senokot-S Take 1 tablet by mouth at bedtime as needed for moderate constipation.        Discharge Exam: Filed Weights   03/30/23 0946 03/30/23 1700  Weight: 86.2 kg 86.2 kg   Vitals:   03/31/23 0824 03/31/23 1546  BP: 125/86 117/80  Pulse: (!) 55 (!) 56  Resp: 18 18  Temp: 97.7 F (36.5 C) 97.6 F (36.4 C)  SpO2: 98% 100%   Examination: Physical Exam:  Constitutional: WN/WD obese Caucasian female acute distress Respiratory: Managed to auscultation bilaterally, no wheezing, rales, rhonchi or crackles. Normal respiratory effort and patient is not tachypenic. No accessory muscle use.  Labored breathing Cardiovascular: RRR, no murmurs / rubs / gallops. S1 and S2 auscultated. No extremity edema.  Abdomen: Soft, non-tender, distended secondary to body habitus. Bowel sounds positive.  GU: Deferred. Musculoskeletal: No clubbing / cyanosis of digits/nails. No joint deformity upper and lower extremities.  Neurologic: CN 2-12 grossly intact with no focal deficits. Romberg sign and cerebellar reflexes not assessed.  Psychiatric: Normal judgment and insight. Alert and oriented x 3. Normal mood and appropriate affect.   Condition at discharge: stable  The results of significant diagnostics from this hospitalization (including imaging, microbiology, ancillary and laboratory) are listed below for reference.   Imaging Studies: VAS US CAROTID Result Date: 03/31/2023 Carotid Arterial Duplex Study Patient Name:  JAILEE JAQUEZ  Date of Exam:   03/31/2023 Medical Rec #: [id number]      Accession #:    [id number] Date of  Birth: 11-23-1955     Patient Gender: F Patient Age:   54 years Exam Location:  [facility] Procedure:      VAS US CAROTID Referring Phys: Madelyn Flavors --------------------------------------------------------------------------------  Indications:       TIA. Risk Factors:      None. Comparison Study:  None. Performing Technologist: Shona Simpson  Examination Guidelines: A complete evaluation includes B-mode imaging, spectral Doppler, color Doppler, and power Doppler as needed of all accessible portions of each vessel.  Bilateral testing is considered an integral part of a complete examination. Limited examinations for reoccurring indications may be performed as noted.  Right Carotid Findings: +----------+--------+--------+--------+------------------+------------------+           PSV cm/sEDV cm/sStenosisPlaque DescriptionComments           +----------+--------+--------+--------+------------------+------------------+ CCA Prox  84      18                                                   +----------+--------+--------+--------+------------------+------------------+ CCA Distal91      24                                intimal thickening +----------+--------+--------+--------+------------------+------------------+ ICA Prox  73      24                                intimal thickening +----------+--------+--------+--------+------------------+------------------+ ICA Distal78      28                                                   +----------+--------+--------+--------+------------------+------------------+ ECA       68      9                                                    +----------+--------+--------+--------+------------------+------------------+ +----------+--------+-------+--------+-------------------+           PSV cm/sEDV cmsDescribeArm Pressure (mmHG) +----------+--------+-------+--------+-------------------+ [id number]     0                                   +----------+--------+-------+--------+-------------------+ +---------+--------+--+--------+--+ VertebralPSV cm/s53EDV cm/s18 +---------+--------+--+--------+--+  Left Carotid Findings: +----------+--------+--------+--------+------------------+------------------+           PSV cm/sEDV cm/sStenosisPlaque DescriptionComments           +----------+--------+--------+--------+------------------+------------------+ CCA Prox  119     27                                                   +----------+--------+--------+--------+------------------+------------------+ CCA Distal78      27                                intimal thickening +----------+--------+--------+--------+------------------+------------------+ ICA Prox  78      22      1-39%   heterogenous                         +----------+--------+--------+--------+------------------+------------------+ ICA Distal50      17                                                   +----------+--------+--------+--------+------------------+------------------+  ECA       79      15                                                   +----------+--------+--------+--------+------------------+------------------+ +----------+--------+--------+--------+-------------------+           PSV cm/sEDV cm/sDescribeArm Pressure (mmHG) +----------+--------+--------+--------+-------------------+ Subclavian103     0                                   +----------+--------+--------+--------+-------------------+ +---------+--------+--+--------+--+ VertebralPSV cm/s46EDV cm/s13 +---------+--------+--+--------+--+   Summary: Right Carotid: The extracranial vessels were near-normal with only minimal wall                thickening or plaque. Left Carotid: Velocities in the left ICA are consistent with a 1-39% stenosis. Vertebrals:  Bilateral vertebral arteries demonstrate antegrade flow. Subclavians: Normal flow hemodynamics were seen in  bilateral subclavian              arteries. *See table(s) above for measurements and observations.  Electronically signed by Sherald Hess MD on 03/31/2023 at 2:00:35 PM.    Final    ECHOCARDIOGRAM COMPLETE Result Date: 03/31/2023    ECHOCARDIOGRAM REPORT   Patient Name:   RONNIKA COLLETT Date of Exam: 03/31/2023 Medical Rec #:  [id number]     Height:       63.0 in Accession #:    [id number]    Weight:       190.0 lb Date of Birth:  1955-08-22    BSA:          1.892 m Patient Age:    67 years      BP:           125/86 mmHg Patient Gender: F             HR:           56 bpm. Exam Location:  Inpatient Procedure: 2D Echo (Both Spectral and Color Flow Doppler were utilized during            procedure). Indications:    stroke  History:        Patient has prior history of Echocardiogram examinations, most                 recent 12/09/2016. Signs/Symptoms:Chest Pain; Risk                 Factors:Hypertension.  Sonographer:    Delcie Roch RDCS Referring Phys: [phone] RONDELL A SMITH IMPRESSIONS  1. Left ventricular ejection fraction, by estimation, is 60 to 65%. The left ventricle has normal function. The left ventricle has no regional wall motion abnormalities. Left ventricular diastolic parameters are consistent with Grade I diastolic dysfunction (impaired relaxation).  2. Right ventricular systolic function is normal. The right ventricular size is normal.  3. The mitral valve is normal in structure. Trivial mitral valve regurgitation. No evidence of mitral stenosis.  4. The aortic valve is normal in structure. Aortic valve regurgitation is not visualized. No aortic stenosis is present.  5. The inferior vena cava is normal in size with greater than 50% respiratory variability, suggesting right atrial pressure of 3 mmHg. FINDINGS  Left Ventricle: Left ventricular ejection fraction, by estimation, is 60 to 65%. The left ventricle has normal  function. The left ventricle has no regional wall motion abnormalities.  Strain imaging was not performed. The left ventricular internal cavity  size was normal in size. There is no left ventricular hypertrophy. Left ventricular diastolic parameters are consistent with Grade I diastolic dysfunction (impaired relaxation). Right Ventricle: The right ventricular size is normal. No increase in right ventricular wall thickness. Right ventricular systolic function is normal. Left Atrium: Left atrial size was normal in size. Right Atrium: Right atrial size was normal in size. Pericardium: There is no evidence of pericardial effusion. Mitral Valve: The mitral valve is normal in structure. Trivial mitral valve regurgitation. No evidence of mitral valve stenosis. Tricuspid Valve: The tricuspid valve is normal in structure. Tricuspid valve regurgitation is trivial. No evidence of tricuspid stenosis. Aortic Valve: The aortic valve is normal in structure. Aortic valve regurgitation is not visualized. No aortic stenosis is present. Pulmonic Valve: The pulmonic valve was normal in structure. Pulmonic valve regurgitation is not visualized. No evidence of pulmonic stenosis. Aorta: The aortic root is normal in size and structure. Venous: The inferior vena cava is normal in size with greater than 50% respiratory variability, suggesting right atrial pressure of 3 mmHg. IAS/Shunts: No atrial level shunt detected by color flow Doppler. Additional Comments: 3D imaging was not performed.  LEFT VENTRICLE PLAX 2D LVIDd:         4.50 cm   Diastology LVIDs:         2.90 cm   LV e' medial:    3.70 cm/s LV PW:         0.90 cm   LV E/e' medial:  15.5 LV IVS:        0.90 cm   LV e' lateral:   7.83 cm/s LVOT diam:     1.90 cm   LV E/e' lateral: 7.3 LV SV:         62 LV SV Index:   33 LVOT Area:     2.84 cm  RIGHT VENTRICLE             IVC RV Basal diam:  2.60 cm     IVC diam: 1.10 cm RV S prime:     13.20 cm/s TAPSE (M-mode): 2.0 cm LEFT ATRIUM             Index        RIGHT ATRIUM           Index LA Vol (A2C):   37.3  ml 19.71 ml/m  RA Area:     12.80 cm LA Vol (A4C):   36.7 ml 19.40 ml/m  RA Volume:   29.20 ml  15.43 ml/m LA Biplane Vol: 37.4 ml 19.77 ml/m  AORTIC VALVE LVOT Vmax:   106.00 cm/s LVOT Vmean:  64.000 cm/s LVOT VTI:    0.218 m  AORTA Ao Root diam: 2.90 cm Ao Asc diam:  3.50 cm MITRAL VALVE MV Area (PHT): 2.77 cm    SHUNTS MV Decel Time: 274 msec    Systemic VTI:  0.22 m MV E velocity: 57.50 cm/s  Systemic Diam: 1.90 cm MV A velocity: 91.10 cm/s MV E/A ratio:  0.63 Arvilla Meres MD Electronically signed by Arvilla Meres MD Signature Date/Time: 03/31/2023/9:44:08 AM    Final    DG Hip Unilat W or Wo Pelvis 2-3 Views Left Result Date: 03/30/2023 CLINICAL DATA:  Left lower extremity pain after fall. EXAM: DG HIP (WITH OR WITHOUT PELVIS) 2-3V LEFT COMPARISON:  None Available. FINDINGS: No evidence of acute fracture  of the pelvis or left hip. No left hip dislocation. Minor joint space narrowing with acetabular spurring. No erosion or avascular necrosis. Pubic rami are intact. Pubic symphysis and sacroiliac joints are congruent. IMPRESSION: 1. No acute fracture or subluxation of the left hip. 2. Mild left hip osteoarthritis. Electronically Signed   By: Narda Rutherford M.D.   On: 03/30/2023 15:10   DG Knee Complete 4 Views Left Result Date: 03/30/2023 CLINICAL DATA:  Left lower extremity pain after fall EXAM: LEFT KNEE - COMPLETE 4+ VIEW COMPARISON:  01/22/2015 FINDINGS: No evidence of fracture, dislocation, or joint effusion. The bones are subjectively under mineralized. Chondrocalcinosis. Soft tissues are unremarkable. IMPRESSION: 1. No fracture or subluxation of the left knee. 2. Chondrocalcinosis. Electronically Signed   By: Narda Rutherford M.D.   On: 03/30/2023 15:09   DG Ankle Complete Left Result Date: 03/30/2023 CLINICAL DATA:  Left lower extremity pain after fall. EXAM: LEFT ANKLE COMPLETE - 3+ VIEW COMPARISON:  None Available. FINDINGS: The bones are subjectively under mineralized. There is  no evidence of fracture, dislocation, or joint effusion. Plantar calcaneal spur and Achilles tendon enthesophyte. There is no evidence of arthropathy or other focal bone abnormality. Soft tissues are unremarkable. IMPRESSION: 1. No fracture or subluxation of the left ankle. 2. Plantar calcaneal spur and Achilles tendon enthesophyte. Electronically Signed   By: Narda Rutherford M.D.   On: 03/30/2023 15:08   MR BRAIN WO CONTRAST Result Date: 03/30/2023 CLINICAL DATA:  Neuro deficit, acute, stroke suspected EXAM: MRI HEAD WITHOUT CONTRAST TECHNIQUE: Multiplanar, multiecho pulse sequences of the brain and surrounding structures were obtained without intravenous contrast. COMPARISON:  Truncated MRI performed earlier today. CT head from earlier today. FINDINGS: Please note that DWI/ADC and sagittal T1 were not performed given these were performed on scan earlier today. Brain: No evidence of acute infarct on MRI earlier today. Moderate T2/FLAIR hyperintensity in the white matter nonspecific but compatible with chronic microvascular ischemic disease. No evidence of acute hemorrhage, mass lesion, midline shift or hydrocephalus. Vascular: Major arterial flow voids are maintained at the skull base. Skull and upper cervical spine: Normal marrow signal. Sinuses/Orbits: Sinuses are largely clear. No acute orbital findings. Other: No mastoid effusions. IMPRESSION: Sequences not performed on truncated earlier MRI were done on this exam. No evidence of acute abnormality. Moderate chronic microvascular ischemic disease. Electronically Signed   By: Feliberto Harts M.D.   On: 03/30/2023 13:53   MR ANGIO HEAD WO CONTRAST Result Date: 03/30/2023 CLINICAL DATA:  68 year old female code stroke presentation. Status post fall. Numbness. EXAM: MRA HEAD WITHOUT CONTRAST TECHNIQUE: Angiographic images of the Circle of Willis were acquired using MRA technique without intravenous contrast. COMPARISON:  Head CT, truncated brain MRI today  reported separately. FINDINGS: Mildly motion degraded. Anterior circulation: Antegrade flow in both ICA siphons. Tortuous right ICA just below the skull base. Ophthalmic and left posterior communicating artery origins appear normal. Patent carotid termini, MCA and ACA origins. Visible ACA branches are within normal limits. Bilateral MCA M1 segments, left MCA trifurcation, right MCA bifurcation appear patent without stenosis. Visible MCA branches are within normal limits. Posterior circulation: Antegrade flow in the posterior circulation. Distal vertebral arteries appear to be codominant. Vertebrobasilar junction and basilar arteries are patent. Fetal type left PCA origin. SCA and right PCA origins are patent. Bilateral PCA branches are within normal limits. Right posterior communicating artery is diminutive or absent. Anatomic variants: Fetal type left PCA origin. Other: Brain MRI today reported separately. IMPRESSION: Negative intracranial MRA when allowing  for mild motion artifact. Electronically Signed   By: Odessa Fleming M.D.   On: 03/30/2023 12:53   MR BRAIN WO CONTRAST Result Date: 03/30/2023 CLINICAL DATA:  68 year old female code stroke presentation. Status post fall. Numbness. EXAM: MRI HEAD WITHOUT CONTRAST TECHNIQUE: Multiplanar, multiecho pulse sequences of the brain and surrounding structures were obtained without intravenous contrast. COMPARISON:  Head CT today 1118 hours. FINDINGS: The examination was discontinued prior to completion by patient request. Sagittal T1 imaging, axial and coronal DWI was obtained. No restricted diffusion or evidence of acute infarction. No midline shift, mass effect, evidence of mass lesion, ventriculomegaly. Normal basilar cisterns, cervicomedullary junction and pituitary are within normal limits. Visualized bone marrow signal is within normal limits. Negative for age visible cervical spine. IMPRESSION: Truncated exam, negative for acute infarct. Electronically Signed   By:  Odessa Fleming M.D.   On: 03/30/2023 12:50   CT HEAD CODE STROKE WO CONTRAST Addendum Date: 03/30/2023 ADDENDUM REPORT: 03/30/2023 11:35 ADDENDUM: Study discussed by telephone with Dr. Coralee Pesa on 03/30/2023 at 1127 hours. Electronically Signed   By: Odessa Fleming M.D.   On: 03/30/2023 11:35   Result Date: 03/30/2023 CLINICAL DATA:  Code stroke. 68 year old female status post fall. Numbness. EXAM: CT HEAD WITHOUT CONTRAST TECHNIQUE: Contiguous axial images were obtained from the base of the skull through the vertex without intravenous contrast. RADIATION DOSE REDUCTION: This exam was performed according to the departmental dose-optimization program which includes automated exposure control, adjustment of the mA and/or kV according to patient size and/or use of iterative reconstruction technique. COMPARISON:  Head CT 07/21/2013. FINDINGS: Brain: Cerebral volume loss since 2015 appears to be generalized. No midline shift, mass effect, or evidence of intracranial mass lesion. No ventriculomegaly. No acute intracranial hemorrhage identified. Patchy, scattered, moderate for age bilateral cerebral white matter hypodensity is chronic but progressed. Deep white matter capsule involvement. Deep gray nuclei and posterior fossa gray-white differentiation remain within normal limits. No cortically based acute infarct identified. Vascular: No suspicious intracranial vascular hyperdensity. Skull: Stable since 2015, intact. Sinuses/Orbits: Visualized paranasal sinuses and mastoids are clear. Other: No orbit or scalp soft tissue injury identified. ASPECTS [facility] Stroke Program Early CT Score) Total score (0-10 with 10 being normal): 10 IMPRESSION: 1. No acute cortically based infarct or acute intracranial hemorrhage identified. ASPECTS 10. 2. Moderately advanced cerebral white matter disease, nonspecific but most commonly due to small vessel ischemia. 3.  No acute traumatic injury identified. Electronically Signed: By: Odessa Fleming M.D.  On: 03/30/2023 11:23   Microbiology: Results for orders placed or performed during the hospital encounter of 10/02/21  SARS Coronavirus 2 by RT PCR (hospital order, performed in [facility] hospital lab) *cepheid single result test* Anterior Nasal Swab     Status: Abnormal   Collection Time: 10/02/21  7:46 PM   Specimen: Anterior Nasal Swab  Result Value Ref Range Status   SARS Coronavirus 2 by RT PCR POSITIVE (A) NEGATIVE Final    Comment: (NOTE) SARS-CoV-2 target nucleic acids are DETECTED  SARS-CoV-2 RNA is generally detectable in upper respiratory specimens  during the acute phase of infection.  Positive results are indicative  of the presence of the identified virus, but do not rule out bacterial infection or co-infection with other pathogens not detected by the test.  Clinical correlation with patient history and  other diagnostic information is necessary to determine patient infection status.  The expected result is negative.  Fact Sheet for Patients:   RoadLapTop.co.za   Fact Sheet for  Healthcare Providers:   http://kim-miller.com/    This test is not yet approved or cleared by the [place] and  has been authorized for detection and/or diagnosis of SARS-CoV-2 by FDA under an Emergency Use Authorization (EUA).  This EUA will remain in effect (meaning this test can be used) for the duration of  the COVID-19 declaration under Section 564(b)(1)  of the Act, 21 U.S.C. section 360-bbb-3(b)(1), unless the authorization is terminated or revoked sooner.   Performed at [facility], [address], [place], [place] [address]   Urine Culture     Status: Abnormal   Collection Time: 10/02/21 10:04 PM   Specimen: Urine, Clean Catch  Result Value Ref Range Status   Specimen Description   Final    URINE, CLEAN CATCH Performed at [facility], [address] [place], [place] [address]     Special Requests   Final    NONE Performed at [facility], [address], [place], [place] [address]    Culture >=100,000 COLONIES/mL KLEBSIELLA PNEUMONIAE (A)  Final   Report Status 10/05/2021 FINAL  Final   Organism ID, Bacteria KLEBSIELLA PNEUMONIAE (A)  Final      Susceptibility   Klebsiella pneumoniae - MIC*    AMPICILLIN >=32 RESISTANT Resistant     CEFAZOLIN <=4 SENSITIVE Sensitive     CEFEPIME <=0.12 SENSITIVE Sensitive     CEFTRIAXONE <=0.25 SENSITIVE Sensitive     CIPROFLOXACIN <=0.25 SENSITIVE Sensitive     GENTAMICIN <=1 SENSITIVE Sensitive     IMIPENEM <=0.25 SENSITIVE Sensitive     NITROFURANTOIN 64 INTERMEDIATE Intermediate     TRIMETH/SULFA <=20 SENSITIVE Sensitive     AMPICILLIN/SULBACTAM 4 SENSITIVE Sensitive     PIP/TAZO <=4 SENSITIVE Sensitive     * >=100,000 COLONIES/mL KLEBSIELLA PNEUMONIAE   Labs: CBC: Recent Labs  Lab 03/30/23 1100 03/31/23 0947  WBC 5.9 5.1  NEUTROABS 3.9 3.0  HGB 13.1 14.3  HCT 38.4 41.1  MCV 87.7 86.0  PLT 191 218   Basic Metabolic Panel: Recent Labs  Lab 03/30/23 1100 03/30/23 1817 03/31/23 0947  NA 141  --  138  K 3.8  --  4.0  CL 110  --  105  CO2 24  --  22  GLUCOSE 85  --  98  BUN 18  --  17  CREATININE 0.71  --  0.88  CALCIUM 8.7*  --  9.4  MG  --  1.9 2.0  PHOS  --   --  4.5   Liver Function Tests: Recent Labs  Lab 03/31/23 0947  AST 19  ALT 17  ALKPHOS 78  BILITOT 0.6  PROT 7.0  ALBUMIN 3.8   CBG: No results for input(s): "GLUCAP" in the last 168 hours.  Discharge time spent: greater than 30 minutes.  Signed: Marguerita Merles, DO [facility] 04/02/2023

## 2023-04-02 NOTE — Hospital Course (Signed)
 HPI per Dr. Madelyn Flavors  Marisa Ryan is a 68 y.o. female with medical history significant of  s/p left nephrectomy , GERD, and obesity presents with head and leg pain following a fall. She is accompanied by her husband.   She fell off a chair this morning when it broke from under her while she was trying to access her lower kitchen cabinets. During the fall, she hit her head twice on the floor, primarily on the left side, and experienced pain in her head.   She describes the leg pain as affecting her whole left side and notes that lifting the leg causes some pain. No weakness on one side versus the other, although her whole left side hurts.   While in the emergency room, she experienced numbness and tingling on the left side of her lips, which persists slightly. No numbness or tingling in her arms or legs.   She mentions intermittent chest pain that occurs occasionally, sometimes after physical activity when she sits down. She took a baby aspirin last night due to chest pain, which was before the fall. She describes the chest pain as 'very uncomfortable' but denies that it takes her breath away. She occasionally feels her heart racing and recalls past EKGs showing some irregularities.   She has a family history of heart issues, with her father having had a heart attack in his early forties and her mother having undergone major heart surgery.   She mentions having only one kidney and regularly checks her kidney function. Her thyroid levels are consistently at the high end of normal.    [facility] patient was seen as a code stroke. noted to be afebrile with blood pressures elevated up to 155/101.   CT manage no acute infarct or intracranial hemorrhage.  Exam revealed left lower extremity drift and mild sensory disturbance of the left side of the face arm and leg.  Labs noted calcium mildly low at 8.7, and CBC with differential along with remainder of BMP within normal limits.  MRI and MRI of the  head did not note any acute signs for stroke.  **Interim History Evaluated by neurology and they feel that her symptoms were a postconcussive episode given her fall and hitting her head.  Her left-sided face numbness was improving and neurology does not feel that she had a TIA or stroke and recommending just aspirin 81 mg daily.  They recommended blood pressure management and risk management and she is now stable for discharge given that she is improved and PT OT recommending no follow-up.  Assessment and Plan:  Left-sided face numbness in the setting of postconcussive headache -Acute.   -Patient reports having left-sided numbness following her fall this morning.  CT of the head did not note any acute abnormality.  Patient was not a candidate for thrombolytics due to symptoms being mild.  -Subsequent MRI with MRA of the head did not note any acute abnormalities.   -Admit to a telemetry bed given that she improved she was changed to observation -Neurochecks -Check lipid panel as below -Check carotid Doppler ultrasound and showed "Right Carotid: The extracranial vessels were near-normal with only minimal wall              thickening or plaque. Left Carotid: Velocities in the left ICA are consistent with a 1-39% stenosis." -Check Echocardiogram and had a normal EF as below -PT/OT/speech to evaluate and treat -Aspirin 81 mg recommended -Appreciate neurology consultative services, will follow-up for any further  recommendations and they do not feel that she had a stroke or TIA and they recommended just continue aspirin 81   Head contusion and left leg pain due to fall -Acute.   -Patient reports that she was sitting in a broken chair when it broke underneath her causing her to fall backwards hitting her head on the floor.   -Imaging of the head did not note any acute abnormality.  X-rays of the hip, left knee, and left ankle did not note any fractures.   -Up with assistance -Hydrocodone as needed  for pain -Is improving stable for discharge   Chest pain, improved Palpitations -Patient does report having intermittent chest discomfort and palpitations prior to today.   -EKG reveals no significant ischemic changes.   -Records note prior nuclear stress back in 12/2016 which was noted to be low risk.   -She does report family history significant for heart disease. -Lipid panel done and showed a total cholesterol/HDL ratio 3.9, cholesterol level 137, HDL 35, LDL of 83, triglycerides 97, VLDL 19 -Check TSH was 3.1 on 8 and cardiac troponin x 2 were negative and flat at 3 -Echocardiogram done and showed an EF of 60 to 65% no regional wall motion abnormalities and grade 1 diastolic dysfunction -Follow-up telemetry overnight and was unremarkable -Consider need of workup based off findings and recommending following with cardiology in outpatient setting if necessary   Hypocalcemia Acute.  Calcium noted to be mildly low at 8.7.  Abnormal calcium levels can cause paresthesias. -Give 1 g of calcium gluconate IV -Check magnesium level -Calcium Level Trend: Recent Labs  Lab 03/30/23 1100 03/31/23 0947  CALCIUM 8.7* 9.4  -Continue to monitor and replace as needed   Elevated blood pressure -Patient's blood pressures range from 111/80 to 155/101.  Patient is not on any blood pressure medications. 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  -Pathology did not note any signs for malignancy. -BUN/Cr Trend: Recent Labs  Lab 03/30/23 1100 03/31/23 0947  BUN 18 17  CREATININE 0.71 0.88   -Avoid Nephrotoxic Medications, Contrast Dyes, Hypotension and Dehydration to Ensure Adequate Renal Perfusion and will need to Renally Adjust Meds -Continue to Monitor and Trend Renal Function carefully and repeat CMP in the AM    GERD -Continue pharmacy  substitution of Protonix   Class IObesity -Complicates overall prognosis and care -Estimated body mass index is 33.66 kg/m as calculated from the following:   Height as of this encounter: 5\' 3"  (1.6 m).   Weight as of this encounter: 86.2 kg.  -Weight Loss and Dietary Counseling given

## 2023-04-10 ENCOUNTER — Other Ambulatory Visit (HOSPITAL_BASED_OUTPATIENT_CLINIC_OR_DEPARTMENT_OTHER): Payer: Self-pay

## 2023-08-28 ENCOUNTER — Encounter (HOSPITAL_BASED_OUTPATIENT_CLINIC_OR_DEPARTMENT_OTHER): Payer: Self-pay

## 2023-08-28 ENCOUNTER — Other Ambulatory Visit: Payer: Self-pay

## 2023-08-28 ENCOUNTER — Emergency Department (HOSPITAL_BASED_OUTPATIENT_CLINIC_OR_DEPARTMENT_OTHER): Admitting: Radiology

## 2023-08-28 ENCOUNTER — Emergency Department (HOSPITAL_BASED_OUTPATIENT_CLINIC_OR_DEPARTMENT_OTHER)

## 2023-08-28 ENCOUNTER — Emergency Department (HOSPITAL_BASED_OUTPATIENT_CLINIC_OR_DEPARTMENT_OTHER)
Admission: EM | Admit: 2023-08-28 | Discharge: 2023-08-28 | Disposition: A | Discharge status: Home / Self Care | Attending: Emergency Medicine | Admitting: Emergency Medicine

## 2023-08-28 DIAGNOSIS — S0003XA Contusion of scalp, initial encounter: Secondary | ICD-10-CM | POA: Insufficient documentation

## 2023-08-28 DIAGNOSIS — W01198A Fall on same level from slipping, tripping and stumbling with subsequent striking against other object, initial encounter: Secondary | ICD-10-CM | POA: Insufficient documentation

## 2023-08-28 DIAGNOSIS — Z7982 Long term (current) use of aspirin: Secondary | ICD-10-CM | POA: Diagnosis not present

## 2023-08-28 DIAGNOSIS — S7002XA Contusion of left hip, initial encounter: Secondary | ICD-10-CM | POA: Diagnosis not present

## 2023-08-28 DIAGNOSIS — S5002XA Contusion of left elbow, initial encounter: Secondary | ICD-10-CM | POA: Diagnosis not present

## 2023-08-28 DIAGNOSIS — W19XXXA Unspecified fall, initial encounter: Secondary | ICD-10-CM

## 2023-08-28 MED ORDER — ACETAMINOPHEN 500 MG PO TABS
1000.0000 mg | ORAL_TABLET | Freq: Once | ORAL | Status: AC
  Administered 2023-08-28: 1000 mg via ORAL
  Filled 2023-08-28: qty 2

## 2023-08-28 NOTE — ED Triage Notes (Signed)
 Pt c/o mechanical fall at home, walking through front door like I do every day, next thing I know I'm on the floor. Advises she's been seen before for fall, & that L side is still weak & I think that's what happened. Advises she hit L side again, including my head, hip & elbow.

## 2023-08-29 NOTE — ED Provider Notes (Signed)
  EMERGENCY DEPARTMENT AT [facility] Provider Note   CSN: [id number] Arrival date & time: 08/28/23  8365     Patient presents with: No chief complaint on file.   Marisa Ryan is a 68 y.o. female.   Pt is a 68 yo female with pmhx significant for gerd, overactive bladder and hx left nephrectomy.  Pt said she fell on the door threshold today.  She did hit her head.  She is not sure what she tripped on, but thinks it was the rug by the door.  No loc.  She has been ambulatory since the fall.  No blood thinners.       Prior to Admission medications   Medication Sig Start Date End Date Taking? Authorizing Provider  acetaminophen  (TYLENOL ) 325 MG tablet Take 2 tablets (650 mg total) by mouth every 4 (four) hours as needed for mild pain (pain score 1-3) (or temp > 37.5 C (99.5 F)). 03/31/23   Sheikh, Omair Latif, DO  aspirin  EC 81 MG tablet Take 81 mg by mouth daily. Swallow whole.    [provider]  omeprazole (PRILOSEC) 40 MG capsule Take 40 mg by mouth every evening.  07/28/10   [provider]  oxybutynin  (DITROPAN  XL) 15 MG 24 hr tablet Take 15 mg by mouth at bedtime. 08/22/18   [provider]  senna-docusate (SENOKOT-S) 8.6-50 MG tablet Take 1 tablet by mouth at bedtime as needed for moderate constipation. 03/31/23   Sheikh, Omair Latif, DO    Allergies: Cortisone, Ivp dye [iodinated contrast media], and Zofran     Review of Systems  Musculoskeletal:        Left elbow, left hip pain  Neurological:  Positive for headaches.  All other systems reviewed and are negative.   Updated Vital Signs BP 115/81 (BP Location: Right Arm)   Pulse (!) 51   Temp 98.6 F (37 C)   Resp 17   SpO2 100%   Physical Exam Vitals and nursing note reviewed.  Constitutional:      Appearance: Normal appearance.  HENT:     Head: Normocephalic and atraumatic.     Right Ear: External ear normal.     Left Ear: External ear normal.     Nose: Nose normal.      Mouth/Throat:     Mouth: Mucous membranes are moist.     Pharynx: Oropharynx is clear.  Eyes:     Extraocular Movements: Extraocular movements intact.     Conjunctiva/sclera: Conjunctivae normal.     Pupils: Pupils are equal, round, and reactive to light.  Cardiovascular:     Rate and Rhythm: Normal rate and regular rhythm.     Pulses: Normal pulses.     Heart sounds: Normal heart sounds.  Pulmonary:     Effort: Pulmonary effort is normal.     Breath sounds: Normal breath sounds.  Abdominal:     General: Abdomen is flat. Bowel sounds are normal.     Palpations: Abdomen is soft.  Musculoskeletal:     Cervical back: Normal range of motion.     Comments: Left elbow tenderness, but good rom Left hip tenderness, but good rom and ability to bear weight  Skin:    General: Skin is warm.     Capillary Refill: Capillary refill takes less than 2 seconds.  Neurological:     General: No focal deficit present.     Mental Status: She is alert and oriented to person, place, and time.  Psychiatric:  Mood and Affect: Mood normal.        Behavior: Behavior normal.     (all labs ordered are listed, but only abnormal results are displayed) Labs Reviewed - No data to display  EKG: None  Radiology: DG Elbow 2 Views Left Result Date: 08/28/2023 CLINICAL DATA:  Pain after fall. EXAM: LEFT ELBOW - 2 VIEW COMPARISON:  None Available. FINDINGS: There is no evidence of fracture, dislocation, or joint effusion. There is no evidence of arthropathy. Small olecranon spur. Soft tissues are unremarkable. IMPRESSION: No fracture or dislocation of the left elbow. Electronically Signed   By: Andrea Gasman M.D.   On: 08/28/2023 18:08   DG Hip Unilat W or Wo Pelvis 2-3 Views Left Result Date: 08/28/2023 CLINICAL DATA:  Hip pain after fall. EXAM: DG HIP (WITH OR WITHOUT PELVIS) 2-3V LEFT COMPARISON:  None Available. FINDINGS: No acute fracture of the pelvis or left hip. No hip dislocation, the femoral  head is well seated. Mild bilateral hip joint space narrowing. Pubic rami are intact. No pubic symphyseal or sacroiliac diastasis. IMPRESSION: 1. No acute fracture or dislocation of the pelvis or left hip. 2. Mild bilateral hip osteoarthritis. Electronically Signed   By: Andrea Gasman M.D.   On: 08/28/2023 18:07   CT Head Wo Contrast Result Date: 08/28/2023 CLINICAL DATA:  Polytrauma, blunt mechanical fall, hit head on floor Mechanical fall at home. EXAM: CT HEAD WITHOUT CONTRAST TECHNIQUE: Contiguous axial images were obtained from the base of the skull through the vertex without intravenous contrast. RADIATION DOSE REDUCTION: This exam was performed according to the departmental dose-optimization program which includes automated exposure control, adjustment of the mA and/or kV according to patient size and/or use of iterative reconstruction technique. COMPARISON:  Most recent head CT 03/30/2023 FINDINGS: Brain: No intracranial hemorrhage, mass effect, or midline shift. Stable brain volume. No hydrocephalus. The basilar cisterns are patent. Stable periventricular and deep chronic small vessel ischemia. No evidence of territorial infarct or acute ischemia. No extra-axial or intracranial fluid collection. Vascular: No hyperdense vessel or unexpected calcification. Skull: No fracture or focal lesion. Sinuses/Orbits: Paranasal sinuses and mastoid air cells are clear. The visualized orbits are unremarkable. Other: No confluent scalp hematoma. IMPRESSION: 1. No acute intracranial abnormality. No skull fracture. 2. Stable atrophy and chronic small vessel ischemia. Electronically Signed   By: Andrea Gasman M.D.   On: 08/28/2023 17:59     Procedures   Medications Ordered in the ED  acetaminophen  (TYLENOL ) tablet 1,000 mg (1,000 mg Oral Given 08/28/23 1834)                                    Medical Decision Making Amount and/or Complexity of Data Reviewed Radiology: ordered.  Risk OTC  drugs.   This patient presents to the ED for concern of fall, this involves an extensive number of treatment options, and is a complaint that carries with it a high risk of complications and morbidity.  The differential diagnosis includes multiple trauma   Co morbidities that complicate the patient evaluation  gerd, overactive bladder and hx left nephrectomy   Additional history obtained:  Additional history obtained from epic chart review External records from outside source obtained and reviewed including husband   Imaging Studies ordered:  I ordered imaging studies including ct head, left elbow, left hip  I independently visualized and interpreted imaging which showed  CT head: No acute intracranial abnormality. No  skull fracture.  2. Stable atrophy and chronic small vessel ischemia.  Left elbow: No fracture or dislocation of the left elbow.  Left hip: No acute fracture or dislocation of the pelvis or left hip.  2. Mild bilateral hip osteoarthritis.   I agree with the radiologist interpretation   Medicines ordered and prescription drug management:  I ordered medication including tylenol   for sx  Reevaluation of the patient after these medicines showed that the patient improved I have reviewed the patients home medicines and have made adjustments as needed   Test Considered:  ct   Problem List / ED Course:  Mechanical fall:  no internal injury. Pt is ambulatory.  She is stable for d/c.  Return if worse.  F/u with pcp.   Reevaluation:  After the interventions noted above, I reevaluated the patient and found that they have :improved   Social Determinants of Health:  Lives at home   Dispostion:  After consideration of the diagnostic results and the patients response to treatment, I feel that the patent would benefit from discharge with outpatient f/u.       Final diagnoses:  Fall, initial encounter  Contusion of scalp, initial encounter  Contusion  of left elbow, initial encounter  Contusion of left hip, initial encounter    ED Discharge Orders     None          Dean Clarity, MD 08/29/23 1627
